# Patient Record
Sex: Male | Born: 1970 | Race: White | Hispanic: No | Marital: Single | Smoking: Never smoker
Health system: Southern US, Community
[De-identification: ages and names within clinical notes are randomized; demographics above are authoritative.]

## PROBLEM LIST (undated history)

## (undated) DIAGNOSIS — T7840XA Allergy, unspecified, initial encounter: Secondary | ICD-10-CM

## (undated) DIAGNOSIS — I10 Essential (primary) hypertension: Secondary | ICD-10-CM

## (undated) DIAGNOSIS — I499 Cardiac arrhythmia, unspecified: Secondary | ICD-10-CM

## (undated) DIAGNOSIS — H269 Unspecified cataract: Secondary | ICD-10-CM

## (undated) DIAGNOSIS — E785 Hyperlipidemia, unspecified: Secondary | ICD-10-CM

## (undated) DIAGNOSIS — F32A Depression, unspecified: Secondary | ICD-10-CM

## (undated) DIAGNOSIS — E119 Type 2 diabetes mellitus without complications: Secondary | ICD-10-CM

## (undated) DIAGNOSIS — R569 Unspecified convulsions: Secondary | ICD-10-CM

## (undated) HISTORY — DX: Allergy, unspecified, initial encounter: T78.40XA

## (undated) HISTORY — DX: Unspecified cataract: H26.9

## (undated) HISTORY — DX: Essential (primary) hypertension: I10

## (undated) HISTORY — DX: Cardiac arrhythmia, unspecified: I49.9

## (undated) HISTORY — DX: Depression, unspecified: F32.A

## (undated) HISTORY — DX: Unspecified convulsions: R56.9

## (undated) HISTORY — PX: CATARACT EXTRACTION: SUR2

## (undated) HISTORY — PX: MOLE REMOVAL: SHX2046

## (undated) HISTORY — DX: Type 2 diabetes mellitus without complications: E11.9

## (undated) HISTORY — PX: DENTAL RESTORATION/EXTRACTION WITH X-RAY: SHX5796

## (undated) HISTORY — PX: OTHER SURGICAL HISTORY: SHX169

## (undated) HISTORY — DX: Hyperlipidemia, unspecified: E78.5

---

## 1996-07-06 HISTORY — PX: HERNIA REPAIR: SHX51

## 2017-11-02 ENCOUNTER — Ambulatory Visit: Payer: Self-pay | Admitting: Internal Medicine

## 2017-11-02 ENCOUNTER — Encounter: Payer: Self-pay | Admitting: Internal Medicine

## 2017-11-02 VITALS — BP 200/120 | HR 76 | Resp 12 | Ht 69.0 in | Wt 235.0 lb

## 2017-11-02 DIAGNOSIS — M79672 Pain in left foot: Secondary | ICD-10-CM

## 2017-11-02 DIAGNOSIS — Z6834 Body mass index (BMI) 34.0-34.9, adult: Secondary | ICD-10-CM

## 2017-11-02 DIAGNOSIS — M79671 Pain in right foot: Secondary | ICD-10-CM

## 2017-11-02 DIAGNOSIS — E119 Type 2 diabetes mellitus without complications: Secondary | ICD-10-CM

## 2017-11-02 DIAGNOSIS — E6609 Other obesity due to excess calories: Secondary | ICD-10-CM

## 2017-11-02 DIAGNOSIS — I10 Essential (primary) hypertension: Secondary | ICD-10-CM

## 2017-11-02 LAB — GLUCOSE, POCT (MANUAL RESULT ENTRY): POC Glucose: 330 mg/dl — AB (ref 70–99)

## 2017-11-02 MED ORDER — LISINOPRIL 10 MG PO TABS: 10.0000 mg | ORAL_TABLET | Freq: Every day | ORAL | 11 refills | Status: DC

## 2017-11-02 MED ORDER — GLUCOSE BLOOD VI STRP: ORAL_STRIP | 11 refills | Status: DC

## 2017-11-02 MED ORDER — AGAMATRIX ULTRA-THIN LANCETS MISC: 11 refills | Status: DC

## 2017-11-02 MED ORDER — AGAMATRIX PRESTO W/DEVICE KIT: PACK | 0 refills | Status: DC

## 2017-11-02 MED ORDER — METFORMIN HCL ER 500 MG PO TB24: 500.0000 mg | ORAL_TABLET | Freq: Every day | ORAL | 11 refills | Status: DC

## 2017-11-02 MED ORDER — GLIPIZIDE 5 MG PO TABS: 5.0000 mg | ORAL_TABLET | Freq: Every day | ORAL | 11 refills | Status: DC

## 2017-11-02 NOTE — Patient Instructions (Signed)
Keeping track of sugars:  In a spiral bound notebook      Month Name (January) Day of month   B   L   D  BT 1   Time /sugar level    7:45 pm  150 2 3 4    Drink a glass of water before every meal Drink 6-8 glasses of water daily Eat three meals daily Eat a protein and healthy fat with every meal (eggs,fish, chicken, [place] and limit red meats) Eat 5 servings of vegetables daily, mix the colors Eat 2 servings of fruit daily with skin, if skin is edible Use smaller plates Put food/utensils down as you chew and swallow each bite Eat at a table with friends/family at least once daily, no TV Do not eat in front of the TV  Recent studies show that people who consume all of their calories in a 12 hour period lose weight more efficiently.  For example, if you eat your first meal at 7:00 a.m., your last meal of the day should be completed by 7:00 p.m.
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
## 2017-11-03 ENCOUNTER — Other Ambulatory Visit: Payer: Self-pay | Admitting: Licensed Clinical Social Worker

## 2017-11-03 NOTE — Telephone Encounter (Signed)
LCSW called patient to introduce social work services available at the clinic. Pt reported that he was "zen" and had no needs at this time.

## 2017-11-04 ENCOUNTER — Other Ambulatory Visit: Payer: Self-pay

## 2017-11-04 DIAGNOSIS — E119 Type 2 diabetes mellitus without complications: Secondary | ICD-10-CM

## 2017-11-04 DIAGNOSIS — Z13220 Encounter for screening for lipoid disorders: Secondary | ICD-10-CM

## 2017-11-04 DIAGNOSIS — Z79899 Other long term (current) drug therapy: Secondary | ICD-10-CM

## 2017-11-05 LAB — HGB A1C W/O EAG: Hgb A1c MFr Bld: 8.7 % — ABNORMAL HIGH (ref 4.8–5.6)

## 2017-11-05 LAB — CBC WITH DIFFERENTIAL/PLATELET
Basophils Absolute: 0 10*3/uL (ref 0.0–0.2)
Basos: 0 %
EOS (ABSOLUTE): 0.2 10*3/uL (ref 0.0–0.4)
Eos: 2 %
HEMATOCRIT: 45.3 % (ref 37.5–51.0)
Hemoglobin: 15.4 g/dL (ref 13.0–17.7)
Immature Grans (Abs): 0 10*3/uL (ref 0.0–0.1)
Immature Granulocytes: 0 %
LYMPHS ABS: 2.6 10*3/uL (ref 0.7–3.1)
Lymphs: 24 %
MCH: 30.1 pg (ref 26.6–33.0)
MCHC: 34 g/dL (ref 31.5–35.7)
MCV: 89 fL (ref 79–97)
MONOS ABS: 0.5 10*3/uL (ref 0.1–0.9)
Monocytes: 5 %
NEUTROS ABS: 7.3 10*3/uL — AB (ref 1.4–7.0)
Neutrophils: 69 %
PLATELETS: 230 10*3/uL (ref 150–379)
RBC: 5.12 x10E6/uL (ref 4.14–5.80)
RDW: 14.5 % (ref 12.3–15.4)
WBC: 10.6 10*3/uL (ref 3.4–10.8)

## 2017-11-05 LAB — COMPREHENSIVE METABOLIC PANEL
ALT: 38 IU/L (ref 0–44)
AST: 26 IU/L (ref 0–40)
Albumin/Globulin Ratio: 1.4 (ref 1.2–2.2)
Albumin: 4 g/dL (ref 3.5–5.5)
Alkaline Phosphatase: 133 IU/L — ABNORMAL HIGH (ref 39–117)
BILIRUBIN TOTAL: 0.5 mg/dL (ref 0.0–1.2)
BUN/Creatinine Ratio: 15 (ref 9–20)
BUN: 12 mg/dL (ref 6–24)
CHLORIDE: 100 mmol/L (ref 96–106)
CO2: 25 mmol/L (ref 20–29)
Calcium: 9.2 mg/dL (ref 8.7–10.2)
Creatinine, Ser: 0.78 mg/dL (ref 0.76–1.27)
GFR calc non Af Amer: 107 mL/min/{1.73_m2} (ref >59)
GFR, EST AFRICAN AMERICAN: 124 mL/min/{1.73_m2} (ref >59)
GLOBULIN, TOTAL: 2.9 g/dL (ref 1.5–4.5)
Glucose: 170 mg/dL — ABNORMAL HIGH (ref 65–99)
Potassium: 4 mmol/L (ref 3.5–5.2)
SODIUM: 140 mmol/L (ref 134–144)
Total Protein: 6.9 g/dL (ref 6.0–8.5)

## 2017-11-05 LAB — LIPID PANEL W/O CHOL/HDL RATIO
CHOLESTEROL TOTAL: 125 mg/dL (ref 100–199)
HDL: 26 mg/dL — AB (ref >39)
LDL CALC: 58 mg/dL (ref 0–99)
Triglycerides: 207 mg/dL — ABNORMAL HIGH (ref 0–149)
VLDL CHOLESTEROL CAL: 41 mg/dL — AB (ref 5–40)

## 2017-11-05 LAB — MICROALBUMIN / CREATININE URINE RATIO
Creatinine, Urine: 85.7 mg/dL
MICROALB/CREAT RATIO: 28.9 mg/g{creat} (ref 0.0–30.0)
MICROALBUM., U, RANDOM: 24.8 ug/mL

## 2017-11-05 MED ORDER — METFORMIN HCL ER 500 MG PO TB24: ORAL_TABLET | ORAL | 11 refills | Status: DC

## 2017-11-17 ENCOUNTER — Ambulatory Visit: Payer: Self-pay | Admitting: Internal Medicine

## 2017-11-17 ENCOUNTER — Encounter: Payer: Self-pay | Admitting: Internal Medicine

## 2017-11-17 VITALS — BP 200/102 | HR 80 | Resp 12 | Ht 69.0 in | Wt 233.0 lb

## 2017-11-17 DIAGNOSIS — I10 Essential (primary) hypertension: Secondary | ICD-10-CM

## 2017-11-17 DIAGNOSIS — E119 Type 2 diabetes mellitus without complications: Secondary | ICD-10-CM

## 2017-11-17 LAB — GLUCOSE, POCT (MANUAL RESULT ENTRY): POC Glucose: 183 mg/dl — AB (ref 70–99)

## 2017-11-17 MED ORDER — HYDROCHLOROTHIAZIDE 25 MG PO TABS: ORAL_TABLET | ORAL | 11 refills | Status: DC

## 2017-11-17 NOTE — Progress Notes (Signed)
   Subjective:    Patient ID: Timothy [name], male    DOB: May 23, 1971, 47 y.o.   MRN: [id number]  HPI   1.  Essential Hypertension:  BP at home running 160-170/ 100 on Lisinopril 10 mg.  Had BMP drawn already this morning.    2.  DM:   Sugar was 183 this afternoon.  Sugars have been running in mid to high 100s, except for recently eating hard candy.   Feet not burning or tingling as much.  Current Meds  Medication Sig  . AGAMATRIX ULTRA-THIN LANCETS MISC Check blood glucose twice daily before meals  . Blood Glucose Monitoring Suppl (AGAMATRIX PRESTO) w/Device KIT Check blood glucose twice daily before meals  . glipiZIDE (GLUCOTROL) 5 MG tablet Take 1 tablet (5 mg total) by mouth daily before breakfast.  . glucose blood (AGAMATRIX PRESTO TEST) test strip Check blood glucose twice daily before meals  . ibuprofen (ADVIL,MOTRIN) 200 MG tablet Take 200 mg by mouth every 6 (six) hours as needed.  Marland Kitchen lisinopril (PRINIVIL,ZESTRIL) 10 MG tablet Take 1 tablet (10 mg total) by mouth daily.  . metFORMIN (GLUCOPHAGE-XR) 500 MG 24 hr tablet 1 tab by mouth twice daily with meals  . oxyCODONE-acetaminophen (PERCOCET) 10-325 MG tablet Take 1 tablet by mouth every 4 (four) hours as needed for pain.    Allergies  Allergen Reactions  . Povidone-Iodine Other (See Comments)    BLISTERS    Review of Systems     Objective:   Physical Exam NAD Lungs:  CTA CV: RRR without murmur or rub, radial pulses normal and equal        Assessment & Plan:  1.  Essential Hypertension:  Continue Lisinopril 10 mg daily and add HCTZ 25 mg daily. BP and pulse check with BMP in 2 weeks.  2.  DM:  Sugars much improved. He did not realize he was to take Metformin twice daily.  Is taking Glipizide just once in the morning with Metformin.  Increase Metformin ER to twice daily.  If sugars drop too low, to cut back on Glipizide to 1/2 tab daily.  3.  Peripheral Neuropathy/likely other nerve damage to feet/legs in past:   Hold on starting Gabapentin as seems to be improved.

## 2017-12-01 ENCOUNTER — Other Ambulatory Visit: Payer: Self-pay

## 2017-12-01 VITALS — BP 148/88 | HR 98

## 2017-12-01 DIAGNOSIS — I10 Essential (primary) hypertension: Secondary | ICD-10-CM

## 2017-12-01 DIAGNOSIS — Z79899 Other long term (current) drug therapy: Secondary | ICD-10-CM

## 2017-12-01 NOTE — Progress Notes (Signed)
Patient BP has come down significantly. Per Dr. Amil Amen have patient to continue current dose of medication. Patient verbalized understanding

## 2017-12-02 LAB — BASIC METABOLIC PANEL
BUN / CREAT RATIO: 16 (ref 9–20)
BUN: 15 mg/dL (ref 6–24)
CO2: 26 mmol/L (ref 20–29)
CREATININE: 0.94 mg/dL (ref 0.76–1.27)
Calcium: 10 mg/dL (ref 8.7–10.2)
Chloride: 96 mmol/L (ref 96–106)
GFR calc non Af Amer: 96 mL/min/{1.73_m2} (ref >59)
GFR, EST AFRICAN AMERICAN: 111 mL/min/{1.73_m2} (ref >59)
Glucose: 214 mg/dL — ABNORMAL HIGH (ref 65–99)
Potassium: 3.7 mmol/L (ref 3.5–5.2)
Sodium: 140 mmol/L (ref 134–144)

## 2018-01-17 DIAGNOSIS — M79672 Pain in left foot: Secondary | ICD-10-CM

## 2018-01-17 DIAGNOSIS — Z6834 Body mass index (BMI) 34.0-34.9, adult: Secondary | ICD-10-CM

## 2018-01-17 DIAGNOSIS — E118 Type 2 diabetes mellitus with unspecified complications: Secondary | ICD-10-CM | POA: Insufficient documentation

## 2018-01-17 DIAGNOSIS — M79671 Pain in right foot: Secondary | ICD-10-CM | POA: Insufficient documentation

## 2018-01-17 DIAGNOSIS — I10 Essential (primary) hypertension: Secondary | ICD-10-CM | POA: Insufficient documentation

## 2018-01-17 DIAGNOSIS — E6609 Other obesity due to excess calories: Secondary | ICD-10-CM | POA: Insufficient documentation

## 2018-01-18 ENCOUNTER — Encounter: Payer: Self-pay | Admitting: Internal Medicine

## 2018-01-18 ENCOUNTER — Ambulatory Visit: Payer: Self-pay | Admitting: Internal Medicine

## 2018-01-18 VITALS — BP 180/102 | HR 96 | Resp 12 | Ht 69.0 in | Wt 232.0 lb

## 2018-01-18 DIAGNOSIS — E6609 Other obesity due to excess calories: Secondary | ICD-10-CM

## 2018-01-18 DIAGNOSIS — Z6834 Body mass index (BMI) 34.0-34.9, adult: Secondary | ICD-10-CM

## 2018-01-18 DIAGNOSIS — I10 Essential (primary) hypertension: Secondary | ICD-10-CM

## 2018-01-18 DIAGNOSIS — E119 Type 2 diabetes mellitus without complications: Secondary | ICD-10-CM

## 2018-01-18 DIAGNOSIS — E1142 Type 2 diabetes mellitus with diabetic polyneuropathy: Secondary | ICD-10-CM

## 2018-01-18 MED ORDER — LISINOPRIL-HYDROCHLOROTHIAZIDE 20-25 MG PO TABS
1.0000 | ORAL_TABLET | Freq: Every day | ORAL | 3 refills | Status: DC
Start: 2018-01-18 — End: 2019-02-09

## 2018-01-18 MED ORDER — GABAPENTIN 100 MG PO CAPS: ORAL_CAPSULE | ORAL | 3 refills | Status: DC

## 2018-01-18 NOTE — Progress Notes (Signed)
   Subjective:    Patient ID: Timothy [name], male    DOB: May 12, 1971, 47 y.o.   MRN: [id number]  HPI   1.  DM:  Sugars generally in 130-150.  Generally both morning and evening meals.  Taking Metformin ER 500 mg twice daily.   Admits to eating a lot of stuff her shouldn't since Memorial Day.   2.  Essential Hypertension:  At home, checking twice daily and running 160/100, though can get into 130-140/high 90s. States he is pain today from doing to much physical work. Missed one day of bp meds only:  Lisinopril and HCTZ.  3.  Ankle and foot pain:  Right foot, plantar aspect at MTP joint.  Cannot tolerate because of pain.   Current Meds  Medication Sig  . AGAMATRIX ULTRA-THIN LANCETS MISC Check blood glucose twice daily before meals  . Blood Glucose Monitoring Suppl (AGAMATRIX PRESTO) w/Device KIT Check blood glucose twice daily before meals  . glipiZIDE (GLUCOTROL) 5 MG tablet Take 1 tablet (5 mg total) by mouth daily before breakfast.  . glucose blood (AGAMATRIX PRESTO TEST) test strip Check blood glucose twice daily before meals  . hydrochlorothiazide (HYDRODIURIL) 25 MG tablet 1 tab by mouth with Lisinopril 10 mg  . ibuprofen (ADVIL,MOTRIN) 200 MG tablet Take 200 mg by mouth every 6 (six) hours as needed.  Marland Kitchen lisinopril (PRINIVIL,ZESTRIL) 10 MG tablet Take 1 tablet (10 mg total) by mouth daily.  . metFORMIN (GLUCOPHAGE-XR) 500 MG 24 hr tablet 1 tab by mouth twice daily with meals    Allergies  Allergen Reactions  . Povidone-Iodine Other (See Comments)    BLISTERS   Review of Systems     Objective:   Physical Exam  NAD Lungs:  CTA CV:  RRR without murmur or rub.  Radial pulses normal and equal   Diabetic Foot Exam - Simple   Simple Foot Form Diabetic Foot exam was performed with the following findings:  Yes 01/18/2018 12:47 PM  Visual Inspection See comments:  Yes Sensation Testing Intact to touch and monofilament testing bilaterally:  Yes Pulse Check Posterior  Tibialis and Dorsalis pulse intact bilaterally:  Yes Comments Discoloration with loss of pigment mottled up foot and ankle.  Chronic appearing and on both feet.       Assessment & Plan:  1.  DM: to work harder at improving lifestyle.  Hopefully can get more active in a pool or once foot pain is improved.     2.  Hypertension:  Increase Lisinopril to 20 mg.  To double up on the 10 mg tabs he has and refill if needed to finish the last of his 90 day supply of HCTZ 25 mg he has at home.   Sent Rx for Lisinopril/HCTZ 20 mg/25 mg to utilize thereafter as less costly. To call in Bp and pulse check in 2 weeks and 1 month  3.  Diabetic peripheral Neuropathy:  Start Gabapentin 100 mg in the evening and gradually titrate to 300 mg 3 times daily.  Fasting labs in 2 month:  FLP, A1C and with me 2 days later.

## 2018-01-18 NOTE — Patient Instructions (Signed)
Gabapentin: Start taking Gabapentin 100 mg cap 1 cap at bedtime. In 3 days, increase to 2 caps at bedtime. In another 3 days, increase to 3 caps at bedtime You should be taking 3 caps at bedtime at this point.  In 3 days, start another 1 cap in the morning In 3 more days, increase to 2 caps in the morning In 3 more days, increase to 3 caps in the morning:  You should be taking 3 caps in the morning and 3 caps at bedtime at this point.  In 3 days, start another dose midday--1 cap In 3 more days, increase to 2 caps midday In 3 more days, increase to 3 caps midday. You should be taking 3 caps 3 times daily at this point.  Stay on this dose until follow up If you do not tolerate increasing the dose at any point, hold on the dose you tolerate or call clinic if having problems 

## 2018-02-08 ENCOUNTER — Telehealth: Payer: Self-pay | Admitting: Internal Medicine

## 2018-02-08 NOTE — Telephone Encounter (Signed)
Doctor Amil Amen wanted message to her, it is as follows: Patient calling in to give bp results Today August 6th pressure is 149/95 83 pulse  Last two three weeks 140 to very low 150  Right foot weirdness still going on even after taking gabapentin NEURONTIN) 100 mg capsule.

## 2018-02-18 ENCOUNTER — Telehealth: Payer: Self-pay | Admitting: Internal Medicine

## 2018-02-18 NOTE — Telephone Encounter (Signed)
Patient instructed to call in to provide BP numbers in the last two weeks. Today BP numbers 136/86 and averages over two weeks 140's/90's.  Cherice please advise.

## 2018-02-21 NOTE — Telephone Encounter (Signed)
To Dr. Mulberry FYI 

## 2018-02-23 NOTE — Telephone Encounter (Signed)
Responded to another note earlier. Notify his foot symptoms will take a while to respond to the Gabapentin.  He should be titrating the dose to 300 mg 3 times daily by increasing by 1 cap every 3 days.  Is he doing that?

## 2018-02-23 NOTE — Telephone Encounter (Signed)
Notify those bps are improving.  To call in again with bps in 1 month

## 2018-03-01 NOTE — Telephone Encounter (Signed)
See next message

## 2018-03-01 NOTE — Telephone Encounter (Signed)
Spoke with patient. States that he is up to Gabapentin 300 mg once a day. States he does not feel he needs to go but he knows he can if he needs to. Patient also aware of BP information. Patient states he is better.

## 2018-03-21 ENCOUNTER — Other Ambulatory Visit: Payer: Self-pay

## 2018-03-21 DIAGNOSIS — Z13220 Encounter for screening for lipoid disorders: Secondary | ICD-10-CM

## 2018-03-21 DIAGNOSIS — E119 Type 2 diabetes mellitus without complications: Secondary | ICD-10-CM

## 2018-03-22 LAB — LIPID PANEL W/O CHOL/HDL RATIO
CHOLESTEROL TOTAL: 131 mg/dL (ref 100–199)
HDL: 24 mg/dL — ABNORMAL LOW (ref >39)
LDL Calculated: 60 mg/dL (ref 0–99)
Triglycerides: 234 mg/dL — ABNORMAL HIGH (ref 0–149)
VLDL CHOLESTEROL CAL: 47 mg/dL — AB (ref 5–40)

## 2018-03-22 LAB — HGB A1C W/O EAG: HEMOGLOBIN A1C: 6.4 % — AB (ref 4.8–5.6)

## 2018-03-23 ENCOUNTER — Encounter: Payer: Self-pay | Admitting: Internal Medicine

## 2018-03-23 ENCOUNTER — Ambulatory Visit: Payer: Self-pay | Admitting: Internal Medicine

## 2018-03-23 VITALS — BP 140/90 | HR 96 | Resp 12 | Ht 69.0 in | Wt 233.0 lb

## 2018-03-23 DIAGNOSIS — I10 Essential (primary) hypertension: Secondary | ICD-10-CM

## 2018-03-23 DIAGNOSIS — E785 Hyperlipidemia, unspecified: Secondary | ICD-10-CM | POA: Insufficient documentation

## 2018-03-23 DIAGNOSIS — E118 Type 2 diabetes mellitus with unspecified complications: Secondary | ICD-10-CM

## 2018-03-23 DIAGNOSIS — M79671 Pain in right foot: Secondary | ICD-10-CM

## 2018-03-23 DIAGNOSIS — Z23 Encounter for immunization: Secondary | ICD-10-CM

## 2018-03-23 DIAGNOSIS — E1142 Type 2 diabetes mellitus with diabetic polyneuropathy: Secondary | ICD-10-CM | POA: Insufficient documentation

## 2018-03-23 MED ORDER — GABAPENTIN 100 MG PO CAPS: ORAL_CAPSULE | ORAL | 11 refills | Status: DC

## 2018-03-23 NOTE — Patient Instructions (Signed)
Titrate gabapentin  Tarsal pad for shoe while awaiting podiatry referral

## 2018-03-23 NOTE — Progress Notes (Signed)
Subjective:    Patient ID: Timothy [name], male    DOB: Nov 12, 1970, 47 y.o.   MRN: [id number]  HPI   1.  DM:  A1C down to 6.4%.  Was physically active for a short period of time due to right foot discomfort with peripheral neuropathy.   Only had low blood sugars (only down to 109) after he was performing cardio exercises.   He continues to eat in a less than optimal way.   Checking sugars intermittently--maybe twice a week or if has a bad meal or doesn't feel well. Generally in 150 or less range  2.  Peripheral Neuropathy:  Sense of not pad under ball of foot or like the foot is swollen. He is only taking 300 mg of Gabapentin at bedtime.  States the numbness and tingling is resolved with this dose. He would be willing to continue titration up for the right foot pain, but he feels the right foot pain is not related to neuropathy He would be interested in seeing Podiatry.    3.  Dyslipidemia:  Low HDL and mildly high Triglycerides, though total and LDL at goal.   Continues with poor diet.  Not clear if will make a change. Feels he can make changes with physical activity only.    4.  HM:  Refuses to come back for influenza.  Discussed Pneumococcal and he would be receptive. No Tdap in past 10 years.  Current Meds  Medication Sig  . AGAMATRIX ULTRA-THIN LANCETS MISC Check blood glucose twice daily before meals  . Blood Glucose Monitoring Suppl (AGAMATRIX PRESTO) w/Device KIT Check blood glucose twice daily before meals  . gabapentin (NEURONTIN) 100 MG capsule Titrate to 3 caps by mouth 3 times daily  . glipiZIDE (GLUCOTROL) 5 MG tablet Take 1 tablet (5 mg total) by mouth daily before breakfast.  . glucose blood (AGAMATRIX PRESTO TEST) test strip Check blood glucose twice daily before meals  . ibuprofen (ADVIL,MOTRIN) 200 MG tablet Take 200 mg by mouth every 6 (six) hours as needed.  Marland Kitchen lisinopril-hydrochlorothiazide (PRINZIDE,ZESTORETIC) 20-25 MG tablet Take 1 tablet by mouth daily.  .  metFORMIN (GLUCOPHAGE-XR) 500 MG 24 hr tablet 1 tab by mouth twice daily with meals  . [DISCONTINUED] hydrochlorothiazide (HYDRODIURIL) 25 MG tablet 1 tab by mouth with Lisinopril 10 mg  . [DISCONTINUED] lisinopril (PRINIVIL,ZESTRIL) 10 MG tablet Take 1 tablet (10 mg total) by mouth daily.   Allergies  Allergen Reactions  . Povidone-Iodine Other (See Comments)    BLISTERS     Review of Systems     Objective:   Physical Exam NAD Lungs:  CTA CV: RRR without murmur or rub.  Radial and DP pulses normal and equal Abd:  S NT, No HSM or mass, + BS Right foot:  With compression of tarsal heads and pressure between 3rd and 4th rays at same level, reproduces pain      Assessment & Plan:  1.  DM:  Much improved control.  Discussed could consider discontinuing Glipizide if he would eat in a better manner. Refuses to come for influenza vaccine tomorrow with free flu clinic. Pneuococcal 23 v and Tdap today. Papers for eye exam given  2.  Dyslipidemia:  Discussed healthier fat sources and swimming to spare right foot pain  3.  Peripheral neuropathy:  Improved.  Not clear if this is part of continued foot pain, likely not.  May try to titrate Gabapentin to 300 mg 3 times daily as discussed originally to see if  helps.  4.  Right foot pain: ?Morton's Neuroma:  Podiatry referral.  Discussed how to place a tarsal pad in shoe. Podiatry referral.  5.  Hypertension:  Improved, but not quite at goal.  Working on physical activity.  Continue current meds.

## 2018-05-05 ENCOUNTER — Other Ambulatory Visit: Payer: Self-pay

## 2018-05-05 MED ORDER — AGAMATRIX ULTRA-THIN LANCETS MISC: 11 refills | Status: DC

## 2018-05-05 MED ORDER — GLUCOSE BLOOD VI STRP: ORAL_STRIP | 11 refills | Status: DC

## 2018-05-05 MED ORDER — GABAPENTIN 100 MG PO CAPS: ORAL_CAPSULE | ORAL | 11 refills | Status: DC

## 2018-07-21 ENCOUNTER — Other Ambulatory Visit: Payer: Self-pay

## 2018-07-21 DIAGNOSIS — E118 Type 2 diabetes mellitus with unspecified complications: Secondary | ICD-10-CM

## 2018-07-21 DIAGNOSIS — E785 Hyperlipidemia, unspecified: Secondary | ICD-10-CM

## 2018-07-22 LAB — HGB A1C W/O EAG: Hgb A1c MFr Bld: 7.5 % — ABNORMAL HIGH (ref 4.8–5.6)

## 2018-07-22 LAB — LIPID PANEL W/O CHOL/HDL RATIO
CHOLESTEROL TOTAL: 131 mg/dL (ref 100–199)
HDL: 24 mg/dL — AB (ref >39)
LDL CALC: 44 mg/dL (ref 0–99)
TRIGLYCERIDES: 316 mg/dL — AB (ref 0–149)
VLDL Cholesterol Cal: 63 mg/dL — ABNORMAL HIGH (ref 5–40)

## 2018-07-25 ENCOUNTER — Other Ambulatory Visit: Payer: Self-pay

## 2018-07-27 ENCOUNTER — Encounter: Payer: Self-pay | Admitting: Internal Medicine

## 2018-07-27 ENCOUNTER — Ambulatory Visit: Payer: Self-pay | Admitting: Internal Medicine

## 2018-07-27 VITALS — BP 150/88 | HR 92 | Resp 12 | Ht 69.0 in | Wt 238.0 lb

## 2018-07-27 DIAGNOSIS — I10 Essential (primary) hypertension: Secondary | ICD-10-CM

## 2018-07-27 DIAGNOSIS — E1142 Type 2 diabetes mellitus with diabetic polyneuropathy: Secondary | ICD-10-CM

## 2018-07-27 DIAGNOSIS — E118 Type 2 diabetes mellitus with unspecified complications: Secondary | ICD-10-CM

## 2018-07-27 DIAGNOSIS — E785 Hyperlipidemia, unspecified: Secondary | ICD-10-CM

## 2018-07-27 LAB — GLUCOSE, POCT (MANUAL RESULT ENTRY): POC Glucose: 248 mg/dl — AB (ref 70–99)

## 2018-07-27 MED ORDER — LISINOPRIL 20 MG PO TABS: 20.0000 mg | ORAL_TABLET | Freq: Every day | ORAL | 11 refills | Status: DC

## 2018-07-27 NOTE — Progress Notes (Signed)
   Subjective:    Patient ID: Timothy [name], male    DOB: [birth year], 48 y.o.   MRN: [id number]  HPI   1.  DM:  Has not been physically active and eating more carbs.  Denies missing Metformin or Glipizide save for today.   Did have an eye check:  Has cataracts.  States no diabetic eye disease.   Was unable to find his glucometer and so has not been monitoring. Starting to have numbness and tingling in fingers again with elevated blood sugars above 200.   A1C up to 7.5%.   States he is able to purchase Agamatrix strips and lancets.   Did not get influenza when we had our free clinics.   2.  Hypertension:  States bp at home has been similar to today even when not missing Lisinopril/HCTZ as did this morning.  3.  Dyslipidemia:  HDL and Trigs not at goal still.  LDL is at goal.  Planning to move to [place] beginning of summer.    Lipid Panel     Component Value Date/Time   CHOL 131 07/21/2018 1005   TRIG 316 (H) 07/21/2018 1005   HDL 24 (L) 07/21/2018 1005   LDLCALC 44 07/21/2018 1005   Current Meds  Medication Sig  . AGAMATRIX ULTRA-THIN LANCETS MISC Check blood glucose twice daily before meals  . Blood Glucose Monitoring Suppl (AGAMATRIX PRESTO) w/Device KIT Check blood glucose twice daily before meals  . gabapentin (NEURONTIN) 100 MG capsule Titrate to 3 caps by mouth 3 times daily  . glipiZIDE (GLUCOTROL) 5 MG tablet Take 1 tablet (5 mg total) by mouth daily before breakfast.  . glucose blood (AGAMATRIX PRESTO TEST) test strip Check blood glucose twice daily before meals  . ibuprofen (ADVIL,MOTRIN) 200 MG tablet Take 200 mg by mouth every 6 (six) hours as needed.  Marland Kitchen lisinopril-hydrochlorothiazide (PRINZIDE,ZESTORETIC) 20-25 MG tablet Take 1 tablet by mouth daily.  . metFORMIN (GLUCOPHAGE-XR) 500 MG 24 hr tablet 1 tab by mouth twice daily with meals    Allergies  Allergen Reactions  . Povidone-Iodine Other (See Comments)    BLISTERS    Review of Systems    Objective:   Physical Exam NAD Lungs:  CTA CV:  RRR without murmur or rub.  Radial pulses normal and equal Abd:  S NT, + BS       Assessment & Plan:  1.  DM:  Worsening control  Encouraged to be consistent with lifestyle and care.   To get back to monitoring sugars States he has the money to purchase glucometer strips and lancets to maintain. Encouraged him to obtain influenza vaccine at [facility] as we are now out.  2.  Hypertension:  As above.  As running high with bp when taking Lisinopril/HCTZ regularly, add another 20 mg of Lisinopril daily. BP and pulse check with BMP in 2 weeks.  3.  Dyslipidemia:  LDL at goal, though HDL and trigs not.  As above with lifestyle changes.  4.  Peripheral Neuropathy:  Gabapentin and control DM better  5.  HM:  HIV check with labs in 2 weeks.  Never checked before.

## 2018-07-27 NOTE — Patient Instructions (Signed)
  HAVE PLAN B TO BE CONSISTENT WITH PHYSICAL ACTIVITY AND EATING HABITS PLEASE  FIND A PLACE FOR MEDICAL CARE IN [place] AS YOU PLAN TO MOVE PLEASE  Drink a glass of water before every meal Drink 6-8 glasses of water daily Eat three meals daily Eat a protein and healthy fat with every meal (eggs,fish, chicken, [place] and limit red meats) Eat 5 servings of vegetables daily, mix the colors Eat 2 servings of fruit daily with skin, if skin is edible Use smaller plates Put food/utensils down as you chew and swallow each bite Eat at a table with friends/family at least once daily, no TV Do not eat in front of the TV  Recent studies show that people who consume all of their calories in a 12 hour period lose weight more efficiently.  For example, if you eat your first meal at 7:00 a.m., your last meal of the day should be completed by 7:00 p.m.

## 2018-08-11 ENCOUNTER — Ambulatory Visit: Payer: Self-pay

## 2018-08-11 VITALS — BP 132/78 | HR 88

## 2018-08-11 DIAGNOSIS — I10 Essential (primary) hypertension: Secondary | ICD-10-CM

## 2018-08-11 NOTE — Progress Notes (Signed)
Patient BP back in normal range. Patient informed to continue to current dose of medication. Patient verbalized understanding.

## 2018-08-12 LAB — BASIC METABOLIC PANEL
BUN/Creatinine Ratio: 12 (ref 9–20)
BUN: 12 mg/dL (ref 6–24)
CO2: 24 mmol/L (ref 20–29)
CREATININE: 1.03 mg/dL (ref 0.76–1.27)
Calcium: 9.4 mg/dL (ref 8.7–10.2)
Chloride: 92 mmol/L — ABNORMAL LOW (ref 96–106)
GFR calc Af Amer: 99 mL/min/{1.73_m2} (ref >59)
GFR calc non Af Amer: 85 mL/min/{1.73_m2} (ref >59)
Glucose: 235 mg/dL — ABNORMAL HIGH (ref 65–99)
POTASSIUM: 3.8 mmol/L (ref 3.5–5.2)
Sodium: 138 mmol/L (ref 134–144)

## 2018-10-26 ENCOUNTER — Telehealth (INDEPENDENT_AMBULATORY_CARE_PROVIDER_SITE_OTHER): Payer: Self-pay | Admitting: Internal Medicine

## 2018-10-26 ENCOUNTER — Other Ambulatory Visit: Payer: Self-pay

## 2018-10-26 VITALS — BP 160/94 | HR 80

## 2018-10-26 DIAGNOSIS — M778 Other enthesopathies, not elsewhere classified: Secondary | ICD-10-CM

## 2018-10-26 DIAGNOSIS — E1142 Type 2 diabetes mellitus with diabetic polyneuropathy: Secondary | ICD-10-CM

## 2018-10-26 DIAGNOSIS — I10 Essential (primary) hypertension: Secondary | ICD-10-CM

## 2018-10-26 DIAGNOSIS — M7582 Other shoulder lesions, left shoulder: Secondary | ICD-10-CM

## 2018-10-26 DIAGNOSIS — E118 Type 2 diabetes mellitus with unspecified complications: Secondary | ICD-10-CM

## 2018-10-26 DIAGNOSIS — E785 Hyperlipidemia, unspecified: Secondary | ICD-10-CM

## 2018-10-26 NOTE — Progress Notes (Signed)
    Subjective:    Patient ID: Timothy [name], male   DOB: 16-Apr-1971, 48 y.o.   MRN: [id number]   HPI   Virtual Video Visit with [employer]. Switched to phone only as video stopped after 15 minutes. Total of 40 minutes with visit.  1.  DM:  Not really checking sugars regularly. Only checks when he doesn't follow a good diet or when not feeling well.   When he gets above 200, he feels that's when he gets numbness in hands as notification. He is now much more physically active since January.  States when he was checking, his sugars were under 150.   He injured his shoulder with the activity 3 weeks ago.     2.  Hypertension:  Increased Lisinopril to 40 mg with continued 20 mg of HCTZ as part of combination with 20 mg of the Lisinopril. BP has been running 130/80.   He is a bit stressed right now as poor sleep   3.  Left shoulder pain and stiffness:  Has developed own physically therapy with door jamb and a massage hook   4.  Dyslipidemia:  Had some difficulty getting protein for a couple of weeks with COVID-19 and ate more carbs.    5.  Peripheral Neuropathy:  He has developed his own gabapentin dosing regimen, which he feels is working well for him.   Gabapentin 200 mg in AM, 100 mg at lunch, 300 mg midday, 100 mg at bedtime.    Current Meds  Medication Sig  . AGAMATRIX ULTRA-THIN LANCETS MISC Check blood glucose twice daily before meals  . Blood Glucose Monitoring Suppl (AGAMATRIX PRESTO) w/Device KIT Check blood glucose twice daily before meals  . gabapentin (NEURONTIN) 100 MG capsule Titrate to 3 caps by mouth 3 times daily  . glipiZIDE (GLUCOTROL) 5 MG tablet Take 1 tablet (5 mg total) by mouth daily before breakfast.  . glucose blood (AGAMATRIX PRESTO TEST) test strip Check blood glucose twice daily before meals  . ibuprofen (ADVIL,MOTRIN) 200 MG tablet Take 200 mg by mouth every 6 (six) hours as needed.  Marland Kitchen lisinopril (PRINIVIL,ZESTRIL) 20 MG tablet Take 1 tablet (20 mg total) by  mouth daily.  Marland Kitchen lisinopril-hydrochlorothiazide (PRINZIDE,ZESTORETIC) 20-25 MG tablet Take 1 tablet by mouth daily.  . metFORMIN (GLUCOPHAGE-XR) 500 MG 24 hr tablet 1 tab by mouth twice daily with meals   Allergies  Allergen Reactions  . Povidone-Iodine Other (See Comments)    BLISTERS     Review of Systems    Objective:   There were no vitals taken for this visit.  Physical Exam Looks well Full ROM of left shoulder with flexion, external rotation, about 90% with abduction, along with mild discomfort, mildly decreased with internal rotation and some associated discomfort as well.  Assessment & Plan  1.  DM:  Not clear how well controlled as not checking sugars frequently.  Will arrange for fasting labs in next 2-4 weeks. He may be moving to [place] in June.  2.  Hypertension:  He was moving about and aggravated with neighbors at time of home bp check today.  Asked him to call in another bp check after resting later today. Sounds like it has been better controlled since January with additional 20 mg Lisinopril. Kidney function remained stable after the addition.  3.  Dyslipidemia:  FLP with fasting labs in coming weeks.  If he will not be moving to [place], to follow up in 3-4 months.  3.  Dyslip

## 2018-10-27 NOTE — Progress Notes (Signed)
NOTED

## 2018-11-23 ENCOUNTER — Other Ambulatory Visit: Payer: Self-pay | Admitting: Internal Medicine

## 2018-11-29 ENCOUNTER — Other Ambulatory Visit: Payer: Self-pay

## 2018-11-29 DIAGNOSIS — E785 Hyperlipidemia, unspecified: Secondary | ICD-10-CM

## 2018-11-30 ENCOUNTER — Other Ambulatory Visit: Payer: Self-pay | Admitting: Internal Medicine

## 2018-11-30 LAB — LIPID PANEL W/O CHOL/HDL RATIO
Cholesterol, Total: 124 mg/dL (ref 100–199)
HDL: 22 mg/dL — ABNORMAL LOW (ref >39)
LDL Calculated: 42 mg/dL (ref 0–99)
Triglycerides: 301 mg/dL — ABNORMAL HIGH (ref 0–149)
VLDL Cholesterol Cal: 60 mg/dL — ABNORMAL HIGH (ref 5–40)

## 2018-11-30 MED ORDER — FISH OIL 1000 MG PO CAPS: ORAL_CAPSULE | ORAL | 0 refills | Status: DC

## 2018-11-30 NOTE — Progress Notes (Signed)
Adding fish oil

## 2018-12-27 ENCOUNTER — Other Ambulatory Visit: Payer: Self-pay | Admitting: Internal Medicine

## 2019-02-06 ENCOUNTER — Other Ambulatory Visit (INDEPENDENT_AMBULATORY_CARE_PROVIDER_SITE_OTHER): Payer: Self-pay

## 2019-02-06 ENCOUNTER — Other Ambulatory Visit: Payer: Self-pay

## 2019-02-06 DIAGNOSIS — E785 Hyperlipidemia, unspecified: Secondary | ICD-10-CM

## 2019-02-07 LAB — LIPID PANEL W/O CHOL/HDL RATIO
Cholesterol, Total: 125 mg/dL (ref 100–199)
HDL: 21 mg/dL — ABNORMAL LOW (ref >39)
LDL Calculated: 60 mg/dL (ref 0–99)
Triglycerides: 219 mg/dL — ABNORMAL HIGH (ref 0–149)
VLDL Cholesterol Cal: 44 mg/dL — ABNORMAL HIGH (ref 5–40)

## 2019-02-09 ENCOUNTER — Other Ambulatory Visit: Payer: Self-pay | Admitting: Internal Medicine

## 2019-02-24 ENCOUNTER — Other Ambulatory Visit: Payer: Self-pay

## 2019-02-24 ENCOUNTER — Encounter: Payer: Self-pay | Admitting: Internal Medicine

## 2019-02-24 ENCOUNTER — Ambulatory Visit: Payer: Self-pay | Admitting: Internal Medicine

## 2019-02-24 VITALS — BP 180/90 | HR 100 | Resp 12 | Ht 69.0 in | Wt 243.0 lb

## 2019-02-24 DIAGNOSIS — N529 Male erectile dysfunction, unspecified: Secondary | ICD-10-CM

## 2019-02-24 DIAGNOSIS — E785 Hyperlipidemia, unspecified: Secondary | ICD-10-CM

## 2019-02-24 DIAGNOSIS — E118 Type 2 diabetes mellitus with unspecified complications: Secondary | ICD-10-CM

## 2019-02-24 DIAGNOSIS — I10 Essential (primary) hypertension: Secondary | ICD-10-CM

## 2019-02-24 MED ORDER — METOPROLOL TARTRATE 25 MG PO TABS: ORAL_TABLET | ORAL | 11 refills | Status: DC

## 2019-02-24 MED ORDER — SILDENAFIL CITRATE 20 MG PO TABS: ORAL_TABLET | ORAL | 1 refills | Status: DC

## 2019-02-24 MED ORDER — ASPIRIN EC 81 MG PO TBEC: 81.0000 mg | DELAYED_RELEASE_TABLET | Freq: Every day | ORAL | Status: DC

## 2019-02-24 NOTE — Progress Notes (Signed)
Subjective:    Patient ID: Timothy [name], male   DOB: [birth year], 48 y.o.   MRN: [id number]   HPI   Lots of stress with his move to [place]/difficulty getting work done at his home with roommates all there now as well during pandemic  1.  Hyperlipidemia:  Taking fish oil 1000 mg twice daily, not 2000 mg twice daily.  He can increase.    2.  DM:  Did not get an A1C with last labs.  Sugars in the morning are mid 100s and above 200 in the evening depending what he eats.  He is not eating well. He does have a desk bike and goes about 20 miles daily currently.  Has been doing this for 1 month.  3.  ED:  Bought Sildenafil through the interweb as difficulties with erection.      4.  Hypertension:  BP running 140/80.  Describes palpitations--states his pulse is regular, but the amplitude of the pulse varies every other beat.  He is tracking this with his phone monitoring the pulse, not him feeling it.  He does not have good sensation in his fingers to check himself.  No chest pain.  Current Meds  Medication Sig  . AGAMATRIX ULTRA-THIN LANCETS MISC Check blood glucose twice daily before meals  . Blood Glucose Monitoring Suppl (AGAMATRIX PRESTO) w/Device KIT Check blood glucose twice daily before meals  . gabapentin (NEURONTIN) 100 MG capsule Titrate to 3 caps by mouth 3 times daily  . glipiZIDE (GLUCOTROL) 5 MG tablet TAKE 1 TABLET BY MOUTH ONCE DAILY BEFORE BREAKFAST  . glucose blood (AGAMATRIX PRESTO TEST) test strip Check blood glucose twice daily before meals  . ibuprofen (ADVIL,MOTRIN) 200 MG tablet Take 200 mg by mouth every 6 (six) hours as needed.  Marland Kitchen lisinopril (PRINIVIL,ZESTRIL) 20 MG tablet Take 1 tablet (20 mg total) by mouth daily.  Marland Kitchen lisinopril-hydrochlorothiazide (ZESTORETIC) 20-25 MG tablet Take 1 tablet by mouth once daily  . metFORMIN (GLUCOPHAGE-XR) 500 MG 24 hr tablet TAKE 1 TABLET BY MOUTH TWICE DAILY WITH MEALS  . Omega-3 Fatty Acids (FISH OIL) 1000 MG CAPS 2 caps by mouth  twice daily.   Allergies  Allergen Reactions  . Povidone-Iodine Other (See Comments)    BLISTERS     Review of Systems    Objective:   BP (!) 180/90 (BP Location: Left Arm, Patient Position: Sitting, Cuff Size: Normal)   Pulse 100   Resp 12   Ht 5' 9"  (1.753 m)   Wt 243 lb (110.2 kg)   BMI 35.88 kg/m   Physical Exam  Appears a bit stressed Lungs:  CTA CV:  RRR with normal S1 and S2, No S3, S4 or murmur.  Radial pulses normal and equal.  A bit tachycardic LE:  No edema   Assessment & Plan  1.  Hypertension:  Add Metoprolol 12.5 mg twice daily.  Call bp and pulse to clinic in 1 week.  Will likely need 25 mg twice daily.  Continue Lisinopril/HCTZ combinations. ASA 81 mg daily as well.  2.  DM:  A1C Encouraged to ask about flu shot clinic when calls in 1 week.  3.  ED:  Good Rx coupon for the 20 mg Sildenafil.  Discussed risks if develops chest pain.    4.  Dyslipidemia:  To gradually increase fish oil to the 2000 mg twice daily last discussed.  Continue with desk bike.  When moves to [place], should have more independence with using the kitchen  and eating healthier.

## 2019-02-24 NOTE — Patient Instructions (Signed)
Call in bps and pulse in 1 week.   No Viagra if you develop chest pain and notify EMS you take Viagra

## 2019-02-25 LAB — HGB A1C W/O EAG: Hgb A1c MFr Bld: 7.5 % — ABNORMAL HIGH (ref 4.8–5.6)

## 2019-03-06 ENCOUNTER — Telehealth: Payer: Self-pay | Admitting: Internal Medicine

## 2019-03-06 NOTE — Telephone Encounter (Signed)
Patient called to provide BP reading today at 3:15 p.m 152/88;  Patient wanted to mention the Metropolol  raised his BP 10 points up on both systolic/diastolic. Patient stated fatigue was really bad especially over the weekend and "made monitoring my brain chemistry really bad." Patient stated on Saturday had a really bad mood and Sunday slept until 5:00 pm; not sure what time went to bed.   Please advise.

## 2019-03-07 ENCOUNTER — Other Ambulatory Visit: Payer: Self-pay

## 2019-03-07 MED ORDER — AMLODIPINE BESYLATE 5 MG PO TABS: 5.0000 mg | ORAL_TABLET | Freq: Every day | ORAL | 1 refills | Status: DC

## 2019-03-07 NOTE — Telephone Encounter (Signed)
Spoke with patient. States he has not tried Amlodipine. Per Dr. Amil Amen start Amlodipine 5 mg 1 daily. Spoke with patient informed of message and he verbalized understanding. Rx sent to [facility].

## 2019-03-07 NOTE — Telephone Encounter (Signed)
Spoke with patient. States since starting Metoprolol he is experiencing mood swings, fatigue and head fg. Patient states the first night he took medication BP was 121/80 but since that night all other readings have been in the 150's/90's. States Sunday he was so fatigued it was 5 pm before he could get out of the bed. Per Dr. Amil Amen find out if he has taken Amlodipine in the past and if so what was the reason for stopping the medication.

## 2019-03-29 ENCOUNTER — Other Ambulatory Visit: Payer: Self-pay | Admitting: Internal Medicine

## 2019-04-14 ENCOUNTER — Other Ambulatory Visit: Payer: Self-pay

## 2019-04-14 ENCOUNTER — Ambulatory Visit (INDEPENDENT_AMBULATORY_CARE_PROVIDER_SITE_OTHER): Payer: Self-pay | Admitting: Internal Medicine

## 2019-04-14 ENCOUNTER — Encounter: Payer: Self-pay | Admitting: Internal Medicine

## 2019-04-14 VITALS — BP 170/90 | HR 86 | Resp 12 | Ht 69.0 in | Wt 243.0 lb

## 2019-04-14 DIAGNOSIS — N529 Male erectile dysfunction, unspecified: Secondary | ICD-10-CM

## 2019-04-14 DIAGNOSIS — E118 Type 2 diabetes mellitus with unspecified complications: Secondary | ICD-10-CM

## 2019-04-14 DIAGNOSIS — I10 Essential (primary) hypertension: Secondary | ICD-10-CM

## 2019-04-14 DIAGNOSIS — E1142 Type 2 diabetes mellitus with diabetic polyneuropathy: Secondary | ICD-10-CM | POA: Diagnosis not present

## 2019-04-14 MED ORDER — LISINOPRIL-HYDROCHLOROTHIAZIDE 20-25 MG PO TABS: ORAL_TABLET | ORAL | 3 refills | Status: DC

## 2019-04-14 MED ORDER — LISINOPRIL 20 MG PO TABS: ORAL_TABLET | ORAL | 3 refills | Status: DC

## 2019-04-14 MED ORDER — SILDENAFIL CITRATE 20 MG PO TABS: ORAL_TABLET | ORAL | 4 refills | Status: DC

## 2019-04-14 MED ORDER — AMLODIPINE BESYLATE 5 MG PO TABS: 5.0000 mg | ORAL_TABLET | Freq: Every day | ORAL | 11 refills | Status: DC

## 2019-04-14 MED ORDER — GABAPENTIN 300 MG PO CAPS: ORAL_CAPSULE | ORAL | 3 refills | Status: DC

## 2019-04-14 NOTE — Progress Notes (Signed)
    Subjective:    Patient ID: Timothy [name], male   DOB: Nov 05, 1970, 48 y.o.   MRN: [id number]   HPI   Will not be moving up to [place] for another year.   1.  Hypertension: Amlodipine was not at the pharmacy and under a lot of stress.  Discussed needs to call if his meds are not available.    Current Meds  Medication Sig  . AGAMATRIX ULTRA-THIN LANCETS MISC Check blood glucose twice daily before meals  . aspirin EC 81 MG tablet Take 1 tablet (81 mg total) by mouth daily.  . Blood Glucose Monitoring Suppl (AGAMATRIX PRESTO) w/Device KIT Check blood glucose twice daily before meals  . gabapentin (NEURONTIN) 100 MG capsule Titrate to 3 caps by mouth 3 times daily  . glipiZIDE (GLUCOTROL) 5 MG tablet TAKE 1 TABLET BY MOUTH ONCE DAILY BEFORE BREAKFAST  . glucose blood (AGAMATRIX PRESTO TEST) test strip Check blood glucose twice daily before meals  . ibuprofen (ADVIL,MOTRIN) 200 MG tablet Take 200 mg by mouth every 6 (six) hours as needed.  Marland Kitchen lisinopril (PRINIVIL,ZESTRIL) 20 MG tablet Take 1 tablet (20 mg total) by mouth daily.  Marland Kitchen lisinopril-hydrochlorothiazide (ZESTORETIC) 20-25 MG tablet Take 1 tablet by mouth once daily  . metFORMIN (GLUCOPHAGE-XR) 500 MG 24 hr tablet TAKE 1 TABLET BY MOUTH TWICE DAILY WITH MEALS  . Omega-3 Fatty Acids (FISH OIL) 1000 MG CAPS 2 caps by mouth twice daily.  . sildenafil (REVATIO) 20 MG tablet 1 tab by mouth as needed prior to activity   Allergies  Allergen Reactions  . Povidone-Iodine Other (See Comments)    BLISTERS     Review of Systems    Objective:   BP (!) 170/90 (BP Location: Left Arm, Patient Position: Sitting, Cuff Size: Normal)   Pulse 86   Resp 12   Ht '5\' 9"'$  (1.753 m)   Wt 243 lb (110.2 kg)   BMI 35.88 kg/m   Physical Exam  NAD HEENT:  PERRL, EOMI Neck: Supple, No adenopathy Chest:  CTA CV:  RRR without murmur or rub.  Radial pulses normal and equal.   LE atrophied lower legs. Skin shiny   Assessment & Plan  1.   Hypertension:  Urged to never miss meds.  All refilled for 1 year.  BP monitor he brought in today with similar reading as ours.  Can call in bp in 1 week.  2.  DM:  Not as well controlled in August.  Fasting labs in 3 months.  FLP, CMP, urine microalbumin/crea, A1C.. OV with me 2 days later. Flu vaccine Thursday  3.  Peripheral Neuropathy:  Change to gabapentin 300 mg caps for lower cost.  Generally only taking twice daily.  4.  ED:  Sildenafil lower dose working well for him.

## 2019-04-14 NOTE — Patient Instructions (Signed)
Call in blood pressure in 1 week.

## 2019-07-14 ENCOUNTER — Other Ambulatory Visit: Payer: Self-pay

## 2019-07-18 ENCOUNTER — Ambulatory Visit: Payer: Self-pay | Admitting: Internal Medicine

## 2019-07-25 ENCOUNTER — Other Ambulatory Visit (INDEPENDENT_AMBULATORY_CARE_PROVIDER_SITE_OTHER): Payer: BC Managed Care – PPO

## 2019-07-25 DIAGNOSIS — E118 Type 2 diabetes mellitus with unspecified complications: Secondary | ICD-10-CM

## 2019-07-25 DIAGNOSIS — E785 Hyperlipidemia, unspecified: Secondary | ICD-10-CM

## 2019-07-25 DIAGNOSIS — Z79899 Other long term (current) drug therapy: Secondary | ICD-10-CM

## 2019-07-26 LAB — MICROALBUMIN / CREATININE URINE RATIO
Creatinine, Urine: 81.3 mg/dL
Microalb/Creat Ratio: 16 mg/g creat (ref 0–29)
Microalbumin, Urine: 13 ug/mL

## 2019-07-26 LAB — LIPID PANEL W/O CHOL/HDL RATIO
Cholesterol, Total: 133 mg/dL (ref 100–199)
HDL: 26 mg/dL — ABNORMAL LOW (ref >39)
LDL Chol Calc (NIH): 73 mg/dL (ref 0–99)
Triglycerides: 198 mg/dL — ABNORMAL HIGH (ref 0–149)
VLDL Cholesterol Cal: 34 mg/dL (ref 5–40)

## 2019-07-26 LAB — COMPREHENSIVE METABOLIC PANEL
ALT: 31 IU/L (ref 0–44)
AST: 21 IU/L (ref 0–40)
Albumin/Globulin Ratio: 1.5 (ref 1.2–2.2)
Albumin: 4.3 g/dL (ref 4.0–5.0)
Alkaline Phosphatase: 115 IU/L (ref 39–117)
BUN/Creatinine Ratio: 12 (ref 9–20)
BUN: 13 mg/dL (ref 6–24)
Bilirubin Total: 0.4 mg/dL (ref 0.0–1.2)
CO2: 27 mmol/L (ref 20–29)
Calcium: 9.4 mg/dL (ref 8.7–10.2)
Chloride: 96 mmol/L (ref 96–106)
Creatinine, Ser: 1.05 mg/dL (ref 0.76–1.27)
GFR calc Af Amer: 96 mL/min/{1.73_m2} (ref >59)
GFR calc non Af Amer: 83 mL/min/{1.73_m2} (ref >59)
Globulin, Total: 2.9 g/dL (ref 1.5–4.5)
Glucose: 139 mg/dL — ABNORMAL HIGH (ref 65–99)
Potassium: 3.9 mmol/L (ref 3.5–5.2)
Sodium: 139 mmol/L (ref 134–144)
Total Protein: 7.2 g/dL (ref 6.0–8.5)

## 2019-07-26 LAB — HGB A1C W/O EAG: Hgb A1c MFr Bld: 6.5 % — ABNORMAL HIGH (ref 4.8–5.6)

## 2019-07-28 ENCOUNTER — Ambulatory Visit: Payer: BC Managed Care – PPO | Admitting: Internal Medicine

## 2019-07-28 ENCOUNTER — Encounter: Payer: Self-pay | Admitting: Internal Medicine

## 2019-07-28 ENCOUNTER — Other Ambulatory Visit: Payer: Self-pay

## 2019-07-28 VITALS — BP 152/80 | HR 98 | Resp 12 | Ht 69.0 in | Wt 220.0 lb

## 2019-07-28 DIAGNOSIS — E118 Type 2 diabetes mellitus with unspecified complications: Secondary | ICD-10-CM | POA: Diagnosis not present

## 2019-07-28 DIAGNOSIS — E785 Hyperlipidemia, unspecified: Secondary | ICD-10-CM

## 2019-07-28 DIAGNOSIS — R002 Palpitations: Secondary | ICD-10-CM | POA: Diagnosis not present

## 2019-07-28 DIAGNOSIS — H269 Unspecified cataract: Secondary | ICD-10-CM

## 2019-07-28 DIAGNOSIS — M25512 Pain in left shoulder: Secondary | ICD-10-CM | POA: Insufficient documentation

## 2019-07-28 DIAGNOSIS — I10 Essential (primary) hypertension: Secondary | ICD-10-CM | POA: Diagnosis not present

## 2019-07-28 DIAGNOSIS — E1142 Type 2 diabetes mellitus with diabetic polyneuropathy: Secondary | ICD-10-CM

## 2019-07-28 LAB — GLUCOSE, POCT (MANUAL RESULT ENTRY): POC Glucose: 206 mg/dl — AB (ref 70–99)
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
## 2019-08-02 ENCOUNTER — Encounter: Payer: Self-pay | Admitting: Family Medicine

## 2019-08-02 ENCOUNTER — Ambulatory Visit: Payer: Self-pay

## 2019-08-02 ENCOUNTER — Other Ambulatory Visit: Payer: Self-pay

## 2019-08-02 ENCOUNTER — Ambulatory Visit: Payer: BC Managed Care – PPO | Admitting: Family Medicine

## 2019-08-02 DIAGNOSIS — G8929 Other chronic pain: Secondary | ICD-10-CM

## 2019-08-02 DIAGNOSIS — M25512 Pain in left shoulder: Secondary | ICD-10-CM
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
## 2019-08-17 ENCOUNTER — Other Ambulatory Visit: Payer: Self-pay

## 2019-08-17 ENCOUNTER — Ambulatory Visit: Payer: BC Managed Care – PPO | Attending: Family Medicine | Admitting: Physical Therapy

## 2019-08-17 ENCOUNTER — Encounter: Payer: Self-pay | Admitting: Physical Therapy

## 2019-08-17 DIAGNOSIS — M25612 Stiffness of left shoulder, not elsewhere classified: Secondary | ICD-10-CM | POA: Diagnosis present

## 2019-08-17 DIAGNOSIS — M25512 Pain in left shoulder: Secondary | ICD-10-CM
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
## 2019-08-17 NOTE — Patient Instructions (Signed)
Access Code: [email or web address]  URL: [email or web address]  Date: 08/17/2019  Prepared by: Lum Babe   Exercises Standing Shoulder Flexion Wall Walk - 10 reps - 1 sets - 10 hold - 2x daily - 7x weekly Seated Shoulder External Rotation PROM on Table - 10 reps - 1 sets - 10 hold - 2x daily - 7x weekly Standing Shoulder Internal Rotation Stretch with Towel - 10 reps - 1 sets - 10 hold - 2x daily - 7x weekly

## 2019-08-21 ENCOUNTER — Ambulatory Visit (HOSPITAL_COMMUNITY)
Admission: RE | Admit: 2019-08-21 | Discharge: 2019-08-21 | Disposition: A | Discharge status: Home / Self Care | Payer: BC Managed Care – PPO | Attending: Psychiatry | Admitting: Psychiatry

## 2019-08-21 DIAGNOSIS — Z915 Personal history of self-harm: Secondary | ICD-10-CM | POA: Diagnosis not present

## 2019-08-21 DIAGNOSIS — F411 Generalized anxiety disorder: Secondary | ICD-10-CM | POA: Diagnosis present

## 2019-08-22 NOTE — H&P (Signed)
Behavioral Health Medical Screening Exam  Timothy [name] is an 49 y.o. male.  Total Time spent with patient: 15 minutes  Psychiatric Specialty Exam: Physical Exam  Constitutional: He is oriented to person, place, and time. He appears well-developed and well-nourished. No distress.  HENT:  Head: Normocephalic and atraumatic.  Right Ear: External ear normal.  Left Ear: External ear normal.  Eyes: Pupils are equal, round, and reactive to light. Right eye exhibits no discharge. Left eye exhibits no discharge.  Respiratory: Effort normal. No respiratory distress.  Musculoskeletal:        General: Normal range of motion.  Neurological: He is alert and oriented to person, place, and time.  Skin: He is not diaphoretic.  Psychiatric: His speech is normal and behavior is normal. His mood appears anxious. Thought content is not paranoid and not delusional. He exhibits a depressed mood. He expresses no homicidal and no suicidal ideation.    Review of Systems  Constitutional: Positive for appetite change and unexpected weight change. Negative for activity change, chills, diaphoresis, fatigue and fever.  HENT: Negative.   Respiratory: Negative for cough and shortness of breath.   Gastrointestinal: Negative for diarrhea, nausea and vomiting.  Musculoskeletal: Negative.   Skin: Negative.   Psychiatric/Behavioral: Positive for dysphoric mood, sleep disturbance and suicidal ideas. Negative for hallucinations and self-injury. The patient is nervous/anxious.     Blood pressure 121/85, pulse (!) 117, temperature 98.4 F (36.9 C), temperature source Oral, resp. rate 20.There is no height or weight on file to calculate BMI.  General Appearance: Casual and Well Groomed  Eye Contact:  Good  Speech:  Clear and Coherent and Normal Rate  Volume:  Normal  Mood:  Anxious and Depressed  Affect:  Congruent and Depressed  Thought Process:  Coherent, Goal Directed, Linear and Descriptions of Associations: Intact   Orientation:  Full (Time, Place, and Person)  Thought Content:  Logical and Hallucinations: None  Suicidal Thoughts:  No  Homicidal Thoughts:  No  Memory:  Immediate;   Good  Judgement:  Fair  Insight:  Fair  Psychomotor Activity:  Normal  Concentration: Concentration: Fair  Recall:  Good  Fund of Knowledge:Good  Language: Good  Akathisia:  Negative  Handed:  Right  AIMS (if indicated):     Assets:  Communication Skills Housing Leisure Time Physical Health  Sleep:       Musculoskeletal: Strength & Muscle Tone: within normal limits Gait & Station: normal Patient leans: N/A  Blood pressure 121/85, pulse (!) 117, temperature 98.4 F (36.9 C), temperature source Oral, resp. rate 20.  Recommendations:  Based on my evaluation the patient does not appear to have an emergency medical condition.  Disposition: No evidence of imminent risk to self or others at present.   Patient does not meet criteria for psychiatric inpatient admission. Supportive therapy provided about ongoing stressors. Discussed crisis plan, support from social network, calling 911, coming to the Emergency Department, and calling Suicide Hotline.  Rozetta Nunnery, NP 08/22/2019, 6:00 AM
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
## 2019-08-30 ENCOUNTER — Encounter: Payer: Self-pay | Admitting: Physical Therapy

## 2019-08-30 ENCOUNTER — Other Ambulatory Visit: Payer: Self-pay

## 2019-08-30 ENCOUNTER — Ambulatory Visit: Payer: BC Managed Care – PPO | Admitting: Physical Therapy

## 2019-08-30 DIAGNOSIS — M25512 Pain in left shoulder: Secondary | ICD-10-CM | POA: Diagnosis not present

## 2019-08-30 DIAGNOSIS — M25612 Stiffness of left shoulder, not elsewhere classified: Secondary | ICD-10-CM
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
## 2019-08-30 NOTE — Patient Instructions (Signed)
Access Code: [email or web address]  URL: [email or web address]  Date: 08/30/2019  Prepared by: Jeral Pinch   Exercises Shoulder External Rotation Reactive Isometrics - 10-15 reps - 2 sets - 1x daily Standing Shoulder Internal Rotation with Anchored Resistance - 10-15 reps - 2 sets - 1x daily Staggered Stance Scapular Retraction and Depression with Resistance - 10-15 reps - 2 sets - 1x daily Scapular Retraction with Resistance - 10-15 reps - 2 sets - 1x daily

## 2019-09-05 ENCOUNTER — Encounter: Payer: BC Managed Care – PPO | Admitting: Physical Therapy

## 2019-09-06 ENCOUNTER — Other Ambulatory Visit: Payer: Self-pay

## 2019-09-06 MED ORDER — GABAPENTIN 300 MG PO CAPS: ORAL_CAPSULE | ORAL | 11 refills | Status: DC

## 2019-09-13 ENCOUNTER — Ambulatory Visit: Payer: BC Managed Care – PPO | Attending: Family Medicine | Admitting: Physical Therapy

## 2019-09-13 ENCOUNTER — Other Ambulatory Visit: Payer: Self-pay

## 2019-09-13 ENCOUNTER — Encounter: Payer: Self-pay | Admitting: Physical Therapy

## 2019-09-13 DIAGNOSIS — M25512 Pain in left shoulder: Secondary | ICD-10-CM | POA: Diagnosis present

## 2019-09-13 DIAGNOSIS — M25612 Stiffness of left shoulder, not elsewhere classified: Secondary | ICD-10-CM | POA: Insufficient documentation
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
## 2019-09-14 ENCOUNTER — Encounter: Payer: BC Managed Care – PPO | Admitting: Physical Therapy

## 2019-09-20 ENCOUNTER — Encounter: Payer: BC Managed Care – PPO | Admitting: Physical Therapy

## 2019-09-27 ENCOUNTER — Encounter: Payer: Self-pay | Admitting: Physical Therapy

## 2019-09-27 ENCOUNTER — Other Ambulatory Visit: Payer: Self-pay

## 2019-09-27 ENCOUNTER — Ambulatory Visit: Payer: BC Managed Care – PPO | Admitting: Physical Therapy

## 2019-09-27 DIAGNOSIS — M25612 Stiffness of left shoulder, not elsewhere classified: Secondary | ICD-10-CM

## 2019-09-27 DIAGNOSIS — M25512 Pain in left shoulder: Secondary | ICD-10-CM | POA: Diagnosis not present
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
## 2019-11-15 ENCOUNTER — Other Ambulatory Visit: Payer: Self-pay

## 2019-11-15 ENCOUNTER — Ambulatory Visit: Payer: BC Managed Care – PPO | Attending: Family Medicine | Admitting: Physical Therapy

## 2019-11-15 ENCOUNTER — Encounter: Payer: Self-pay | Admitting: Physical Therapy

## 2019-11-15 DIAGNOSIS — M25512 Pain in left shoulder: Secondary | ICD-10-CM | POA: Diagnosis not present

## 2019-11-15 DIAGNOSIS — M25612 Stiffness of left shoulder, not elsewhere classified: Secondary | ICD-10-CM | POA: Insufficient documentation
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
## 2019-11-24 ENCOUNTER — Ambulatory Visit: Payer: BC Managed Care – PPO | Admitting: Internal Medicine

## 2019-11-24 ENCOUNTER — Other Ambulatory Visit: Payer: Self-pay

## 2019-11-24 ENCOUNTER — Encounter: Payer: Self-pay | Admitting: Internal Medicine

## 2019-11-24 VITALS — BP 150/80 | HR 90 | Resp 12 | Ht 69.0 in | Wt 224.0 lb

## 2019-11-24 DIAGNOSIS — R002 Palpitations: Secondary | ICD-10-CM | POA: Diagnosis not present

## 2019-11-24 DIAGNOSIS — E118 Type 2 diabetes mellitus with unspecified complications: Secondary | ICD-10-CM

## 2019-11-24 DIAGNOSIS — H269 Unspecified cataract: Secondary | ICD-10-CM | POA: Diagnosis not present

## 2019-11-24 DIAGNOSIS — I10 Essential (primary) hypertension: Secondary | ICD-10-CM

## 2019-11-24 DIAGNOSIS — M79672 Pain in left foot: Secondary | ICD-10-CM

## 2019-11-24 DIAGNOSIS — Z114 Encounter for screening for human immunodeficiency virus [HIV]: Secondary | ICD-10-CM

## 2019-11-24 DIAGNOSIS — R252 Cramp and spasm: Secondary | ICD-10-CM

## 2019-11-24 DIAGNOSIS — M79671 Pain in right foot: Secondary | ICD-10-CM | POA: Diagnosis not present

## 2019-11-24 DIAGNOSIS — Z1211 Encounter for screening for malignant neoplasm of colon: Secondary | ICD-10-CM

## 2019-11-24 DIAGNOSIS — E785 Hyperlipidemia, unspecified: Secondary | ICD-10-CM

## 2019-11-24 NOTE — Patient Instructions (Signed)
Tonic water or tablespoon of yellow mustard or vinegar for leg cramps

## 2019-11-24 NOTE — Progress Notes (Signed)
.     Subjective:    Patient ID: Timothy [name], male   DOB: [birth year], 49 y.o.   MRN: [id number]   HPI   1.  Cataracts:  He did have cataract extraction of right eye.  He is saving up to get the left eye done.    2.  DM:  Has not been eating well with a "cookie fest"  Recently.  Has not been checking sugars.  Not cooking as much as his roommate gets upset with him.   Still drinking Red Bull 2-3 times daily. He does not feel any behavioral counseling will help him with regards to eating better.    3.  Left shoulder:  Done with PT.  Feels he is much better.  Has much improved ROM.    4.  Dyslipidemia:  Perhaps more walking about.    5.  Feet a mess:  Sits for 16 hours daily.  He is working on his gluteal health, so his feet don't go to sleep or burn.  Points to left 5th MTP joint and plantar volar feet in general.  6.  Calf Cramps:  More since more active.  Drinking lots of water.   7.  Hypertension:  BP generally 130/80.  He feels up today as his feet are bothering him.    8.  Arrhythmias:  States his phone monitor catches a rate of up to 191.  Gets clammy.  Lasts maybe 20 minutes to 3 hours.  Can also get HR down to 48 and feels definitively light headed with that.  Cannot say he has chest pressure.  Feels like heart is moving weird in his chest.   ECG was unremarkable last visit in January with complaints of palpitations  Current Meds  Medication Sig  . amLODipine (NORVASC) 5 MG tablet Take 1 tablet (5 mg total) by mouth daily.  Marland Kitchen aspirin EC 81 MG tablet Take 1 tablet (81 mg total) by mouth daily.  Marland Kitchen gabapentin (NEURONTIN) 300 MG capsule 1 three times a day  . glipiZIDE (GLUCOTROL) 5 MG tablet TAKE 1 TABLET BY MOUTH ONCE DAILY BEFORE BREAKFAST  . ibuprofen (ADVIL,MOTRIN) 200 MG tablet Take 200 mg by mouth every 6 (six) hours as needed.  Marland Kitchen lisinopril (ZESTRIL) 20 MG tablet 1 tab by mouth daily with Lisinopril/HCTZ 20-25 mg  . lisinopril-hydrochlorothiazide (ZESTORETIC) 20-25 MG  tablet 1 tab by mouth daily with Lisinopril 20 mg  . metFORMIN (GLUCOPHAGE-XR) 500 MG 24 hr tablet TAKE 1 TABLET BY MOUTH TWICE DAILY WITH MEALS  . Omega-3 Fatty Acids (FISH OIL) 1000 MG CAPS 2 caps by mouth twice daily.  . sildenafil (REVATIO) 20 MG tablet 1 tab by mouth as needed prior to activity   Allergies  Allergen Reactions  . Povidone-Iodine Other (See Comments)    BLISTERS     Review of Systems    Objective:   BP (!) 150/80 (BP Location: Left Arm, Patient Position: Sitting, Cuff Size: Normal)   Pulse 90   Resp 12   Ht 5\' 9"  (1.753 m)   Wt 224 lb (101.6 kg)   BMI 33.08 kg/m   Physical Exam  NAD HEENT:  PERRL, EOMI Neck:  Supple, No adenopathy, no thyromegaly Chest:  CTA CV:  RRR with normal S1 andS2, No S3, S4 or murmur.  No carotid bruits.  Carotid, radial pulses normal and equal.  DP pulses a bit decreased.   Abd:  S, NT, No HSM or mass, + BS LE:  No edema.  Pale and atrophic appearing.  Tender over lateral aspect of left 5th MTP joint without swelling or erythema.  Tender over plantar MTP line of both feet, again no swelling or erythema.    Assessment & Plan   1.  Cataracts:  Right resolved with extraction.  Waiting for funds to treat left.  2.  DM:  Again, encouraged better lifestyle over long haul.  Patient up and down with control, though generally not far above 7.0% with A1C at worst.  A1C  Will continue to encourage counseling to work on eating habits/stress eating.  3.  Left shoulder pain:  Improved with PT.  4.  Dyslipidemia:  Lifestyle management.  LDL at goal without medication, but very low HDL and elevated Trigs.    5.  Loletha Grayer and tachyarrhythmia, with presyncopal symptoms with the former:  ECG previous okay with similar complaint.  BMP, CBC, TSH, free T4.  Cardiology referral.  6.  Essential Hypertension:  Also up and down.  Concerned with his male friend and the stress she causes him.  He is not interested in counseling for this as well.   Denies missing meds.  7.  Leg cramps:  BMP, CBC.  To try tonic water, small amount.  Stay hydrated.  8.  Foot pain:  Does have peripheral neuropathy, but this appears to be something different:  Podiatry referral.  9.  HM:  COVID vaccine complete.  HIV screen.   Referral to GI for screening colonoscopy.

## 2019-11-25 LAB — BASIC METABOLIC PANEL
BUN/Creatinine Ratio: 11 (ref 9–20)
BUN: 13 mg/dL (ref 6–24)
CO2: 28 mmol/L (ref 20–29)
Calcium: 10.2 mg/dL (ref 8.7–10.2)
Chloride: 96 mmol/L (ref 96–106)
Creatinine, Ser: 1.15 mg/dL (ref 0.76–1.27)
GFR calc Af Amer: 86 mL/min/{1.73_m2} (ref >59)
GFR calc non Af Amer: 74 mL/min/{1.73_m2} (ref >59)
Glucose: 148 mg/dL — ABNORMAL HIGH (ref 65–99)
Potassium: 4 mmol/L (ref 3.5–5.2)
Sodium: 139 mmol/L (ref 134–144)

## 2019-11-25 LAB — HIV ANTIBODY (ROUTINE TESTING W REFLEX): HIV Screen 4th Generation wRfx: NONREACTIVE

## 2019-11-25 LAB — CBC WITH DIFFERENTIAL/PLATELET
Basophils Absolute: 0.1 10*3/uL (ref 0.0–0.2)
Basos: 1 %
EOS (ABSOLUTE): 0.1 10*3/uL (ref 0.0–0.4)
Eos: 1 %
Hematocrit: 43.7 % (ref 37.5–51.0)
Hemoglobin: 14.9 g/dL (ref 13.0–17.7)
Immature Grans (Abs): 0 10*3/uL (ref 0.0–0.1)
Immature Granulocytes: 0 %
Lymphocytes Absolute: 1.9 10*3/uL (ref 0.7–3.1)
Lymphs: 18 %
MCH: 28.8 pg (ref 26.6–33.0)
MCHC: 34.1 g/dL (ref 31.5–35.7)
MCV: 85 fL (ref 79–97)
Monocytes Absolute: 0.6 10*3/uL (ref 0.1–0.9)
Monocytes: 6 %
Neutrophils Absolute: 7.7 10*3/uL — ABNORMAL HIGH (ref 1.4–7.0)
Neutrophils: 74 %
Platelets: 268 10*3/uL (ref 150–450)
RBC: 5.17 x10E6/uL (ref 4.14–5.80)
RDW: 13.7 % (ref 11.6–15.4)
WBC: 10.4 10*3/uL (ref 3.4–10.8)

## 2019-11-25 LAB — HGB A1C W/O EAG: Hgb A1c MFr Bld: 7.3 % — ABNORMAL HIGH (ref 4.8–5.6)

## 2019-11-25 LAB — TSH: TSH: 2.4 u[IU]/mL (ref 0.450–4.500)

## 2019-11-25 LAB — T4, FREE: Free T4: 1.45 ng/dL (ref 0.82–1.77)

## 2019-11-29 ENCOUNTER — Encounter: Payer: Self-pay | Admitting: Gastroenterology

## 2019-11-30 ENCOUNTER — Other Ambulatory Visit: Payer: Self-pay

## 2019-11-30 ENCOUNTER — Ambulatory Visit: Payer: BC Managed Care – PPO | Admitting: Cardiology

## 2019-11-30 ENCOUNTER — Telehealth: Payer: Self-pay | Admitting: Radiology

## 2019-11-30 ENCOUNTER — Encounter: Payer: Self-pay | Admitting: Cardiology

## 2019-11-30 VITALS — BP 150/80 | HR 93 | Ht 70.0 in | Wt 239.0 lb

## 2019-11-30 DIAGNOSIS — I10 Essential (primary) hypertension: Secondary | ICD-10-CM

## 2019-11-30 DIAGNOSIS — R55 Syncope and collapse: Secondary | ICD-10-CM

## 2019-11-30 DIAGNOSIS — R002 Palpitations: Secondary | ICD-10-CM | POA: Diagnosis not present

## 2019-11-30 DIAGNOSIS — E785 Hyperlipidemia, unspecified: Secondary | ICD-10-CM

## 2019-11-30 NOTE — Telephone Encounter (Signed)
Enrolled patient for a 14 day Zio monitor to be mailed to patients home.  
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
## 2019-11-30 NOTE — Patient Instructions (Signed)
Medication Instructions:  Your physician recommends that you continue on your current medications as directed. Please refer to the Current Medication list given to you today.  Testing/Procedures: Your physician has requested that you have an echocardiogram. Echocardiography is a painless test that uses sound waves to create images of your heart. It provides your doctor with information about the size and shape of your heart and how well your heart's chambers and valves are working. This procedure takes approximately one hour. There are no restrictions for this procedure.   ZIO XT- Long Term Monitor Instructions   Your physician has requested you wear your ZIO patch monitor 14 days.   This is a single patch monitor.  [employer] supplies one patch monitor per enrollment.  Additional stickers are not available.   Please do not apply patch if you will be having a Nuclear Stress Test, Echocardiogram, Cardiac CT, MRI, or Chest Xray during the time frame you would be wearing the monitor. The patch cannot be worn during these tests.  You cannot remove and re-apply the ZIO XT patch monitor.   Your ZIO patch monitor will be sent USPS Priority mail from [facility] directly to your home address. The monitor may also be mailed to a PO BOX if home delivery is not available.   It may take 3-5 days to receive your monitor after you have been enrolled.   Once you have received you monitor, please review enclosed instructions.  Your monitor has already been registered assigning a specific monitor serial # to you.   Applying the monitor   Shave hair from upper left chest.   Hold abrader disc by orange tab.  Rub abrader in 40 strokes over left upper chest as indicated in your monitor instructions.   Clean area with 4 enclosed alcohol pads .  Use all pads to assure are is cleaned thoroughly.  Let dry.   Apply patch as indicated in monitor instructions.  Patch will be place under collarbone on left side  of chest with arrow pointing upward.   Rub patch adhesive wings for 2 minutes.Remove white label marked "1".  Remove white label marked "2".  Rub patch adhesive wings for 2 additional minutes.   While looking in a mirror, press and release button in center of patch.  A small green light will flash 3-4 times .  This will be your only indicator the monitor has been turned on.     Do not shower for the first 24 hours.  You may shower after the first 24 hours.   Press button if you feel a symptom. You will hear a small click.  Record Date, Time and Symptom in the Patient Log Book.   When you are ready to remove patch, follow instructions on last 2 pages of Patient Log Book.  Stick patch monitor onto last page of Patient Log Book.   Place Patient Log Book in [place] box.  Use locking tab on box and tape box closed securely.  The [employer] has [employer] on it.  Please place in mailbox as soon as possible.  Your physician should have your test results approximately 7 days after the monitor has been mailed back to [facility].   Call [place] at [phone] if you have questions regarding your ZIO XT patch monitor.  Call them immediately if you see an orange light blinking on your monitor.   If your monitor falls off in less than 4 days contact our  Monitor department at [phone].  If your monitor becomes loose or falls off after 4 days call [employer] at [phone] for suggestions on securing your monitor.    Follow-Up: At [facility], you and your health needs are our priority.  As part of our continuing mission to provide you with exceptional heart care, we have created designated Provider Care Teams.  These Care Teams include your primary Cardiologist (physician) and Advanced Practice Providers (APPs -  Physician Assistants and Nurse Practitioners) who all work together to provide you with the care you need, when you need it.  We recommend signing up  for the patient portal called "MyChart".  Sign up information is provided on this After Visit Summary.  MyChart is used to connect with patients for Virtual Visits (Telemedicine).  Patients are able to view lab/test results, encounter notes, upcoming appointments, etc.  Non-urgent messages can be sent to your provider as well.   To learn more about what you can do with MyChart, go to [email or web address].    Your next appointment:   3 month(s)  The format for your next appointment:   In Person  Provider:   Oswaldo Milian, MD   Other Instructions Please check your blood pressure at home daily, write it down.  Call the office of send message via Mychart with the readings in 2 weeks for Dr. Gardiner Rhyme to review.

## 2019-12-01 ENCOUNTER — Other Ambulatory Visit: Payer: Self-pay | Admitting: Internal Medicine

## 2019-12-01 ENCOUNTER — Telehealth: Payer: Self-pay | Admitting: Cardiology

## 2019-12-01 NOTE — Telephone Encounter (Signed)
LMOM to call and schedule echo and 3 mos f/u

## 2019-12-06 ENCOUNTER — Ambulatory Visit (INDEPENDENT_AMBULATORY_CARE_PROVIDER_SITE_OTHER): Payer: BC Managed Care – PPO

## 2019-12-06 DIAGNOSIS — R002 Palpitations: Secondary | ICD-10-CM

## 2019-12-11 NOTE — Addendum Note (Signed)
Addended by: Hinton Dyer on: 12/11/2019 07:53 AM   Modules accepted: Orders

## 2019-12-22 ENCOUNTER — Other Ambulatory Visit: Payer: Self-pay

## 2019-12-22 ENCOUNTER — Ambulatory Visit (HOSPITAL_COMMUNITY): Payer: BC Managed Care – PPO | Attending: Cardiology

## 2019-12-22 DIAGNOSIS — R002 Palpitations: Secondary | ICD-10-CM | POA: Insufficient documentation

## 2019-12-22 MED ORDER — PERFLUTREN LIPID MICROSPHERE
1.0000 mL | INTRAVENOUS | Status: AC | PRN
  Administered 2019-12-22: 1 mL via INTRAVENOUS

## 2019-12-26 ENCOUNTER — Other Ambulatory Visit: Payer: Self-pay

## 2019-12-26 ENCOUNTER — Encounter: Payer: Self-pay | Admitting: Sports Medicine

## 2019-12-26 ENCOUNTER — Ambulatory Visit (INDEPENDENT_AMBULATORY_CARE_PROVIDER_SITE_OTHER): Payer: BC Managed Care – PPO

## 2019-12-26 ENCOUNTER — Ambulatory Visit: Payer: BC Managed Care – PPO | Admitting: Sports Medicine

## 2019-12-26 DIAGNOSIS — M79672 Pain in left foot: Secondary | ICD-10-CM | POA: Diagnosis not present

## 2019-12-26 DIAGNOSIS — M779 Enthesopathy, unspecified: Secondary | ICD-10-CM | POA: Diagnosis not present

## 2019-12-26 DIAGNOSIS — M7742 Metatarsalgia, left foot: Secondary | ICD-10-CM

## 2019-12-26 DIAGNOSIS — M79671 Pain in right foot: Secondary | ICD-10-CM

## 2019-12-26 DIAGNOSIS — M7741 Metatarsalgia, right foot: Secondary | ICD-10-CM

## 2019-12-26 DIAGNOSIS — M2170 Unequal limb length (acquired), unspecified site: Secondary | ICD-10-CM

## 2019-12-26 DIAGNOSIS — M21162 Varus deformity, not elsewhere classified, left knee: Secondary | ICD-10-CM

## 2019-12-26 DIAGNOSIS — M21161 Varus deformity, not elsewhere classified, right knee: Secondary | ICD-10-CM

## 2019-12-26 DIAGNOSIS — E1142 Type 2 diabetes mellitus with diabetic polyneuropathy: Secondary | ICD-10-CM
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
## 2019-12-27 ENCOUNTER — Encounter: Payer: Self-pay | Admitting: Cardiology

## 2019-12-27 ENCOUNTER — Other Ambulatory Visit: Payer: Self-pay

## 2019-12-27 ENCOUNTER — Ambulatory Visit: Payer: BC Managed Care – PPO | Admitting: Cardiology

## 2019-12-27 VITALS — BP 124/68 | HR 103 | Ht 70.0 in | Wt 238.2 lb

## 2019-12-27 DIAGNOSIS — R55 Syncope and collapse: Secondary | ICD-10-CM | POA: Diagnosis not present

## 2019-12-27 DIAGNOSIS — I10 Essential (primary) hypertension: Secondary | ICD-10-CM | POA: Diagnosis not present

## 2019-12-27 DIAGNOSIS — E785 Hyperlipidemia, unspecified: Secondary | ICD-10-CM | POA: Diagnosis not present

## 2019-12-27 DIAGNOSIS — R002 Palpitations: Secondary | ICD-10-CM | POA: Diagnosis not present

## 2019-12-27 MED ORDER — ROSUVASTATIN CALCIUM 5 MG PO TABS
5.0000 mg | ORAL_TABLET | Freq: Every day | ORAL | 3 refills | Status: DC
Start: 2019-12-27 — End: 2020-01-16

## 2019-12-27 NOTE — Patient Instructions (Addendum)
Medication Instructions:  START rosuvastatin (Crestor) 5 mg daily  Follow-Up: At [facility], you and your health needs are our priority.  As part of our continuing mission to provide you with exceptional heart care, we have created designated Provider Care Teams.  These Care Teams include your primary Cardiologist (physician) and Advanced Practice Providers (APPs -  Physician Assistants and Nurse Practitioners) who all work together to provide you with the care you need, when you need it.  We recommend signing up for the patient portal called "MyChart".  Sign up information is provided on this After Visit Summary.  MyChart is used to connect with patients for Virtual Visits (Telemedicine).  Patients are able to view lab/test results, encounter notes, upcoming appointments, etc.  Non-urgent messages can be sent to your provider as well.   To learn more about what you can do with MyChart, go to [email or web address].    Your next appointment:   3 month(s)  The format for your next appointment:   In Person  Provider:   Oswaldo Milian, MD
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
## 2019-12-28 ENCOUNTER — Other Ambulatory Visit: Payer: Self-pay | Admitting: Sports Medicine

## 2019-12-28 DIAGNOSIS — M7742 Metatarsalgia, left foot: Secondary | ICD-10-CM

## 2019-12-28 DIAGNOSIS — M7741 Metatarsalgia, right foot: Secondary | ICD-10-CM

## 2020-01-02 ENCOUNTER — Other Ambulatory Visit: Payer: Self-pay

## 2020-01-02 ENCOUNTER — Ambulatory Visit (INDEPENDENT_AMBULATORY_CARE_PROVIDER_SITE_OTHER): Payer: BC Managed Care – PPO | Admitting: Orthotics

## 2020-01-02 DIAGNOSIS — M7741 Metatarsalgia, right foot: Secondary | ICD-10-CM | POA: Diagnosis not present

## 2020-01-02 DIAGNOSIS — M79671 Pain in right foot: Secondary | ICD-10-CM

## 2020-01-02 DIAGNOSIS — M7742 Metatarsalgia, left foot: Secondary | ICD-10-CM | POA: Diagnosis not present

## 2020-01-02 DIAGNOSIS — M2170 Unequal limb length (acquired), unspecified site: Secondary | ICD-10-CM | POA: Diagnosis not present

## 2020-01-02 DIAGNOSIS — M79672 Pain in left foot: Secondary | ICD-10-CM

## 2020-01-02 NOTE — Progress Notes (Signed)
Patient came into today to be cast for [facility]. Upon recommendation of Dr. Cannon Kettle Patient presents with b/l 1/5 pain; signficant LLD R < L, cavus foot Goals are  Hug arch, offload 1/5 b/l and 3/8 lift R Plan vendor

## 2020-01-03 ENCOUNTER — Other Ambulatory Visit: Payer: Self-pay

## 2020-01-03 ENCOUNTER — Ambulatory Visit (AMBULATORY_SURGERY_CENTER): Payer: Self-pay

## 2020-01-03 VITALS — Ht 70.0 in | Wt 234.4 lb

## 2020-01-03 DIAGNOSIS — Z1211 Encounter for screening for malignant neoplasm of colon: Secondary | ICD-10-CM

## 2020-01-03 MED ORDER — SUTAB 1479-225-188 MG PO TABS: 12.0000 | ORAL_TABLET | ORAL | 0 refills | Status: DC

## 2020-01-03 NOTE — Progress Notes (Signed)
No allergies to soy or egg Pt is not on blood thinners or diet pills Denies issues with sedation/intubation Denies atrial flutter/fib Denies constipation   Emmi instructions given to pt  Pt is aware of Covid safety and care partner requirements.  

## 2020-01-05 ENCOUNTER — Telehealth: Payer: Self-pay | Admitting: Cardiology

## 2020-01-05 NOTE — Telephone Encounter (Signed)
SI/HI are not listed as adverse effect of rosuvastatin but agree with d/c medication.  Do not recommend rechallenging with a different statin unless Dr. Gardiner Rhyme says otherwise.

## 2020-01-05 NOTE — Telephone Encounter (Signed)
Called to speak with patient-patient states the first night he took rosuvastatin he could not go to sleep, did not go to bed until 6am that morning.    The next couple of days he could not sleep, his head space was "dark", he was having very bad dreams.  All of this lead to "dark thoughts".   He states he stopped the medication, has been off for 2 days and all of the symptoms have resolves.   Denies SI/HI at current.   Denies current dreams, he is able to sleep, his head space is no longer in a dark place.   He denies this ever happening before with any medication, but has had hx of SI in the past.    Again, denies SI at current.   Advised to stay off rosuvastatin and would send to MD and pharmD to review and advise on side effects/possible alternatives.   Concern for similar side effects to other statins?

## 2020-01-05 NOTE — Telephone Encounter (Signed)
Patient called to advise Dr. Gardiner Rhyme that he had to stop taking rosuvastatin (CRESTOR) 5 MG tablet because it was messing up his head space, states it was ramping up suicidal ideation. Patient said that the first 4 hours after taking it, the suicidal thoughts was really dark and he was up til 6am. He also stated that he was constantly up and down throughout the night to go to the bathroom, and states that that did not bother him. He said he is open to any alternative medicine that Dr. Gardiner Rhyme has in mind.

## 2020-01-05 NOTE — Telephone Encounter (Signed)
Message sent to MD/RN to advise on statin side effects and alternatives

## 2020-01-08 NOTE — Telephone Encounter (Signed)
Agree with discontinuing rosuvastatin.  Will hold off on starting alternative statin

## 2020-01-11 ENCOUNTER — Encounter: Payer: Self-pay | Admitting: Gastroenterology

## 2020-01-16 ENCOUNTER — Encounter: Payer: Self-pay | Admitting: Cardiology

## 2020-01-16 ENCOUNTER — Ambulatory Visit: Payer: BC Managed Care – PPO | Admitting: Cardiology

## 2020-01-16 ENCOUNTER — Other Ambulatory Visit: Payer: Self-pay

## 2020-01-16 VITALS — BP 124/76 | HR 89 | Ht 70.0 in | Wt 235.8 lb

## 2020-01-16 DIAGNOSIS — I480 Paroxysmal atrial fibrillation: Secondary | ICD-10-CM | POA: Diagnosis not present

## 2020-01-16 DIAGNOSIS — I10 Essential (primary) hypertension: Secondary | ICD-10-CM

## 2020-01-16 DIAGNOSIS — R0683 Snoring: Secondary | ICD-10-CM | POA: Diagnosis not present

## 2020-01-16 DIAGNOSIS — E785 Hyperlipidemia, unspecified: Secondary | ICD-10-CM

## 2020-01-16 DIAGNOSIS — Z79899 Other long term (current) drug therapy: Secondary | ICD-10-CM

## 2020-01-16 MED ORDER — METOPROLOL TARTRATE 25 MG PO TABS
25.0000 mg | ORAL_TABLET | Freq: Two times a day (BID) | ORAL | 3 refills | Status: DC
Start: 2020-01-16 — End: 2020-01-17

## 2020-01-16 MED ORDER — APIXABAN 5 MG PO TABS: 5.0000 mg | ORAL_TABLET | Freq: Two times a day (BID) | ORAL | 3 refills | Status: DC

## 2020-01-16 NOTE — Telephone Encounter (Signed)
Called and spoke with pt, reviewed results and medication changes. Able to schedule pt for appt today at 1:40pm with Dr.Schumann. they will be able to make changes with medications at this appt. Pt verbalized understanding with no other questions at this time. Will make primary RN aware.

## 2020-01-16 NOTE — Patient Instructions (Addendum)
Medication Instructions:  STOP Aspirin START Eliquis 5 mg two times daily START metoprolol tartrate (Lopressor) 25 mg two times daily  *If you need a refill on your cardiac medications before your next appointment, please call your pharmacy*   Lab Work: BMET, CBC today  If you have labs (blood work) drawn today and your tests are completely normal, you will receive your results only by: Marland Kitchen MyChart Message (if you have MyChart) OR . A paper copy in the mail If you have any lab test that is abnormal or we need to change your treatment, we will call you to review the results.   Testing/Procedures: Your physician has recommended that you have a sleep study. This test records several body functions during sleep, including: brain activity, eye movement, oxygen and carbon dioxide blood levels, heart rate and rhythm, breathing rate and rhythm, the flow of air through your mouth and nose, snoring, body muscle movements, and chest and belly movement. --this must be approved by insurance prior to scheduling  Follow-Up: At [facility], you and your health needs are our priority.  As part of our continuing mission to provide you with exceptional heart care, we have created designated Provider Care Teams.  These Care Teams include your primary Cardiologist (physician) and Advanced Practice Providers (APPs -  Physician Assistants and Nurse Practitioners) who all work together to provide you with the care you need, when you need it.  We recommend signing up for the patient portal called "MyChart".  Sign up information is provided on this After Visit Summary.  MyChart is used to connect with patients for Virtual Visits (Telemedicine).  Patients are able to view lab/test results, encounter notes, upcoming appointments, etc.  Non-urgent messages can be sent to your provider as well.   To learn more about what you can do with MyChart, go to [email or web address].    Your next appointment:   3  month(s)  The format for your next appointment:   In Person  Provider:   Oswaldo Milian, MD
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
## 2020-01-16 NOTE — Telephone Encounter (Signed)
Left message to call back  

## 2020-01-16 NOTE — Telephone Encounter (Signed)
Follow up  ° ° °Patient is returning call.  °

## 2020-01-17 ENCOUNTER — Encounter: Payer: Self-pay | Admitting: Gastroenterology

## 2020-01-17 ENCOUNTER — Ambulatory Visit (AMBULATORY_SURGERY_CENTER): Payer: BC Managed Care – PPO | Admitting: Gastroenterology

## 2020-01-17 VITALS — BP 115/74 | HR 76 | Temp 97.8°F | Resp 12 | Ht 70.0 in | Wt 238.0 lb

## 2020-01-17 DIAGNOSIS — D123 Benign neoplasm of transverse colon: Secondary | ICD-10-CM

## 2020-01-17 DIAGNOSIS — Z1211 Encounter for screening for malignant neoplasm of colon: Secondary | ICD-10-CM | POA: Diagnosis present

## 2020-01-17 LAB — BASIC METABOLIC PANEL
BUN/Creatinine Ratio: 7 — ABNORMAL LOW (ref 9–20)
BUN: 7 mg/dL (ref 6–24)
CO2: 28 mmol/L (ref 20–29)
Calcium: 10 mg/dL (ref 8.7–10.2)
Chloride: 92 mmol/L — ABNORMAL LOW (ref 96–106)
Creatinine, Ser: 0.99 mg/dL (ref 0.76–1.27)
GFR calc Af Amer: 103 mL/min/{1.73_m2} (ref >59)
GFR calc non Af Amer: 89 mL/min/{1.73_m2} (ref >59)
Glucose: 139 mg/dL — ABNORMAL HIGH (ref 65–99)
Potassium: 3.6 mmol/L (ref 3.5–5.2)
Sodium: 136 mmol/L (ref 134–144)

## 2020-01-17 LAB — CBC
Hematocrit: 44.8 % (ref 37.5–51.0)
Hemoglobin: 15.1 g/dL (ref 13.0–17.7)
MCH: 28.1 pg (ref 26.6–33.0)
MCHC: 33.7 g/dL (ref 31.5–35.7)
MCV: 83 fL (ref 79–97)
Platelets: 304 10*3/uL (ref 150–450)
RBC: 5.38 x10E6/uL (ref 4.14–5.80)
RDW: 13.5 % (ref 11.6–15.4)
WBC: 11.1 10*3/uL — ABNORMAL HIGH (ref 3.4–10.8)

## 2020-01-17 MED ORDER — SODIUM CHLORIDE 0.9 % IV SOLN: 500.0000 mL | INTRAVENOUS | Status: DC

## 2020-01-17 NOTE — Progress Notes (Signed)
Called to room to assist during endoscopic procedure.  Patient ID and intended procedure confirmed with present staff. Received instructions for my participation in the procedure from the performing physician.  

## 2020-01-17 NOTE — Patient Instructions (Signed)
YOU HAD AN ENDOSCOPIC PROCEDURE TODAY AT [facility]:   Refer to the procedure report that was given to you for any specific questions about what was found during the examination.  If the procedure report does not answer your questions, please call your gastroenterologist to clarify.  If you requested that your care partner not be given the details of your procedure findings, then the procedure report has been included in a sealed envelope for you to review at your convenience later.  **Handouts given on polyps, hemorrhoids, diverticulosis and a high fiber diet**  **May want to add a daily stool bulking agent like metamucil or benefiber**  YOU SHOULD EXPECT: Some feelings of bloating in the abdomen. Passage of more gas than usual.  Walking can help get rid of the air that was put into your GI tract during the procedure and reduce the bloating. If you had a lower endoscopy (such as a colonoscopy or flexible sigmoidoscopy) you may notice spotting of blood in your stool or on the toilet paper. If you underwent a bowel prep for your procedure, you may not have a normal bowel movement for a few days.  Please Note:  You might notice some irritation and congestion in your nose or some drainage.  This is from the oxygen used during your procedure.  There is no need for concern and it should clear up in a day or so.  SYMPTOMS TO REPORT IMMEDIATELY:   Following lower endoscopy (colonoscopy or flexible sigmoidoscopy):  Excessive amounts of blood in the stool  Significant tenderness or worsening of abdominal pains  Swelling of the abdomen that is new, acute  Fever of 100F or higher   For urgent or emergent issues, a gastroenterologist can be reached at any hour by calling [phone]. Do not use MyChart messaging for urgent concerns.    DIET:  We do recommend a small meal at first, but then you may proceed to your regular diet.  Drink plenty of fluids but you should avoid alcoholic  beverages for 24 hours.  ACTIVITY:  You should plan to take it easy for the rest of today and you should NOT DRIVE or use heavy machinery until tomorrow (because of the sedation medicines used during the test).    FOLLOW UP: Our staff will call the number listed on your records 48-72 hours following your procedure to check on you and address any questions or concerns that you may have regarding the information given to you following your procedure. If we do not reach you, we will leave a message.  We will attempt to reach you two times.  During this call, we will ask if you have developed any symptoms of COVID 19. If you develop any symptoms (ie: fever, flu-like symptoms, shortness of breath, cough etc.) before then, please call [phone].  If you test positive for Covid 19 in the 2 weeks post procedure, please call and report this information to [place].    If any biopsies were taken you will be contacted by phone or by letter within the next 1-3 weeks.  Please call us at [phone] if you have not heard about the biopsies in 3 weeks.    SIGNATURES/CONFIDENTIALITY: You and/or your care partner have signed paperwork which will be entered into your electronic medical record.  These signatures attest to the fact that that the information above on your After Visit Summary has been reviewed and is understood.  Full responsibility of the confidentiality of this  discharge information lies with you and/or your care-partner. 

## 2020-01-17 NOTE — Op Note (Signed)
[facility] Patient Name: Timothy [name] Procedure Date: 01/17/2020 11:22 AM MRN: [id number] Endoscopist: Thornton Park MD, MD Age: 49 Referring MD:  Date of Birth: 1970-09-28 Gender: Male Account #: [id number] Procedure:                Colonoscopy Indications:              Screening for colorectal malignant neoplasm, This                            is the patient's first colonoscopy                           No known family history of colon cancer or polyps. Medicines:                Monitored Anesthesia Care Procedure:                Pre-Anesthesia Assessment:                           - Prior to the procedure, a History and Physical                            was performed, and patient medications and                            allergies were reviewed. The patient's tolerance of                            previous anesthesia was also reviewed. The risks                            and benefits of the procedure and the sedation                            options and risks were discussed with the patient.                            All questions were answered, and informed consent                            was obtained. Prior Anticoagulants: The patient has                            taken no previous anticoagulant or antiplatelet                            agents. ASA Grade Assessment: II - A patient with                            mild systemic disease. After reviewing the risks                            and benefits, the patient was deemed in  satisfactory condition to undergo the procedure.                           After obtaining informed consent, the colonoscope                            was passed under direct vision. Throughout the                            procedure, the patient's blood pressure, pulse, and                            oxygen saturations were monitored continuously. The                            Colonoscope was  introduced through the anus and                            advanced to the 2 cm into the ileum. The                            colonoscopy was performed without difficulty. The                            patient tolerated the procedure well. The quality                            of the bowel preparation was good. The terminal                            ileum, ileocecal valve, appendiceal orifice, and                            rectum were photographed. Scope In: 11:32:23 AM Scope Out: 11:47:34 AM Scope Withdrawal Time: 0 hours 12 minutes 18 seconds  Total Procedure Duration: 0 hours 15 minutes 11 seconds  Findings:                 Non-bleeding external and internal hemorrhoids were                            found. The hemorrhoids were moderate.                           A 3 mm polyp was found in the proximal transverse                            colon. The polyp was sessile. The polyp was removed                            with a cold snare in a piecemeal technique. Given                            the location to a fold, there was  a small residual                            portion of the polyp that could only be removed                            with cold forceps. Resection and retrieval were                            complete. Estimated blood loss was minimal.                           A few small-mouthed diverticula were found in the                            sigmoid colon.                           The exam was otherwise without abnormality on                            direct and retroflexion views. Complications:            No immediate complications. Estimated blood loss:                            Minimal. Estimated Blood Loss:     Estimated blood loss was minimal. Impression:               - Non-bleeding external and internal hemorrhoids.                           - One 3 mm polyp in the proximal transverse colon,                            removed with a cold snare and  cold biopsy forceps.                            Resected and retrieved.                           - The examination was otherwise normal on direct                            and retroflexion views. Recommendation:           - Patient has a contact number available for                            emergencies. The signs and symptoms of potential                            delayed complications were discussed with the                            patient. Return to normal activities tomorrow.  Written discharge instructions were provided to the                            patient.                           - High fiber diet recommended.                           - Continue present medications.                           - Add a daily stool bulking agent such as Metamucil                            or Benefiber every day.                           - Await pathology results.                           - Repeat colonoscopy date to be determined after                            pending pathology results are reviewed for                            surveillance.                           - Emerging evidence supports eating a diet of                            fruits, vegetables, grains, calcium, and yogurt                            while reducing red meat and alcohol may reduce the                            risk of colon cancer.                           - Thank you for allowing me to be involved in your                            colon cancer prevention. Thornton Park MD, MD 01/17/2020 11:54:36 AM This report has been signed electronically.

## 2020-01-17 NOTE — Progress Notes (Signed)
Vs CW I have reviewed the patient's medical history in detail and updated the computerized patient record.   

## 2020-01-17 NOTE — Progress Notes (Signed)
PT taken to PACU. Monitors in place. VSS. Report given to RN. 

## 2020-01-18 ENCOUNTER — Telehealth: Payer: Self-pay | Admitting: Cardiology

## 2020-01-18 NOTE — Telephone Encounter (Signed)
    Pt returning call to get lab results °

## 2020-01-18 NOTE — Telephone Encounter (Signed)
Stable blood counts and kidney function, OK to start Eliquis as planned. Very mild elevation in WBC count, would inquire about infectious symptoms such as fevers, cough, dysuria  Pt aware of lab results and new directions ./cy

## 2020-01-19 ENCOUNTER — Encounter: Payer: Self-pay | Admitting: Gastroenterology

## 2020-01-19 ENCOUNTER — Telehealth: Payer: Self-pay

## 2020-01-19 ENCOUNTER — Telehealth: Payer: Self-pay | Admitting: *Deleted

## 2020-01-19 NOTE — Telephone Encounter (Signed)
Second follow up call made, left message. 

## 2020-01-19 NOTE — Telephone Encounter (Signed)
Attempted to reach pt. With follow-up call following endoscopic procedure 01/17/2020.  LM on pt. Voice mail.  Will try to reach pt. Again later today.

## 2020-01-23 ENCOUNTER — Telehealth: Payer: Self-pay | Admitting: Cardiology

## 2020-01-23 ENCOUNTER — Other Ambulatory Visit: Payer: Self-pay

## 2020-01-23 ENCOUNTER — Ambulatory Visit: Payer: BC Managed Care – PPO | Admitting: Orthotics

## 2020-01-23 DIAGNOSIS — M79672 Pain in left foot: Secondary | ICD-10-CM

## 2020-01-23 DIAGNOSIS — M79671 Pain in right foot: Secondary | ICD-10-CM

## 2020-01-23 DIAGNOSIS — M7741 Metatarsalgia, right foot: Secondary | ICD-10-CM

## 2020-01-23 NOTE — Progress Notes (Signed)
Patient came in today to pick up custom made foot orthotics.  The goals were accomplished and the patient reported no dissatisfaction with said orthotics.  Patient was advised of breakin period and how to report any issues. 

## 2020-01-23 NOTE — Telephone Encounter (Signed)
Pt c/o medication issue:  1. Name of Medication: metoprolol tartrate (LOPRESSOR) 25 MG  2. How are you currently taking this medication (dosage and times per day)? Pt is not taking   3. Are you having a reaction (difficulty breathing--STAT)? No  4. What is your medication issue? Pt had adverse side effects from taking this medication in the past. The patient wanted to know if there was another medication that Dr. Gardiner Rhyme wanted to put him on

## 2020-01-23 NOTE — Telephone Encounter (Signed)
Left message to call back  

## 2020-01-24 NOTE — Telephone Encounter (Signed)
Could try carvedilol instead, would start at 3.125 mg twice daily

## 2020-01-24 NOTE — Telephone Encounter (Signed)
Called patient- he states that he tried it 6 months ago- and he states it caused him to have no energy and caused him to 'shut down'.  He does not want to take it again, and would like to do something else.  Advised I would route to MD/RN to advise.

## 2020-01-24 NOTE — Telephone Encounter (Signed)
Pt called back returning Hayley's Call

## 2020-01-25 MED ORDER — CARVEDILOL 3.125 MG PO TABS
3.1250 mg | ORAL_TABLET | Freq: Two times a day (BID) | ORAL | 3 refills | Status: DC
Start: 2020-01-25 — End: 2020-02-15

## 2020-01-25 NOTE — Telephone Encounter (Signed)
Patient is returning call.  °

## 2020-01-25 NOTE — Telephone Encounter (Signed)
Left message to call back  

## 2020-01-25 NOTE — Telephone Encounter (Signed)
Pt updated with MD's recommendations and verbalized understanding. New Rx sent to requested pharmacy.

## 2020-01-29 ENCOUNTER — Other Ambulatory Visit: Payer: Self-pay | Admitting: Cardiology

## 2020-01-29 DIAGNOSIS — I10 Essential (primary) hypertension: Secondary | ICD-10-CM

## 2020-01-29 DIAGNOSIS — R0683 Snoring: Secondary | ICD-10-CM

## 2020-01-29 DIAGNOSIS — I480 Paroxysmal atrial fibrillation: Secondary | ICD-10-CM

## 2020-01-30 ENCOUNTER — Other Ambulatory Visit: Payer: Self-pay | Admitting: Cardiology

## 2020-01-30 NOTE — Telephone Encounter (Signed)
spoke with the pharmacy and informed them that they can stop refilling crestor.

## 2020-01-30 NOTE — Telephone Encounter (Signed)
New Message:     Pt says he needs to cx his refills on his Rosuvastatin. The Pharmacist said the office had to cancel this please.

## 2020-02-02 ENCOUNTER — Telehealth: Payer: Self-pay | Admitting: Cardiology

## 2020-02-02 NOTE — Telephone Encounter (Signed)
Pt c/o medication issue:  1. Name of Medication: carvedilol (COREG) 3.125 MG tablet  2. How are you currently taking this medication (dosage and times per day)? Yes, for the past 3 days  3. Are you having a reaction (difficulty breathing--STAT)? SOB but that may have been a result of his sporadic HR and low BP  4. What is your medication issue? Pt says his HR was so low his HR didn't register. His BP was 90/70. Pt said he almost fell yesterday on his way to bed. He rested sporadically and just did not feel well   He does not want to take this medication anymore and is not interested in any new meds this year

## 2020-02-02 NOTE — Telephone Encounter (Signed)
Spoke to pt who voiced he started taking carvedilol on Tuesday then yesterday noticed he felt really tired and couldn't process information while working on his computer. He report he decided to take time away and nap. He voiced while walking to his bed he almost passed out. Pt report 8 hours later he checked BP and it was 90/75 HR 98 and hr later 107/74 HR 94. This morning pt voiced BP is 133/89. HR 85. Pt feels symptoms is related to carvedilol.   Will route to MD for recommendations.

## 2020-02-04 NOTE — Telephone Encounter (Signed)
Can stop carvedilol.  Is he willing to try metoprolol again?  We could start at a low dose 12.5 mg twice daily to see if he tolerates

## 2020-02-09 ENCOUNTER — Telehealth: Payer: Self-pay | Admitting: Cardiology

## 2020-02-09 NOTE — Telephone Encounter (Signed)
Unlikely the nodule has anything to do with Eliquis therapy.   Please continue all medication as prescribed (including Eliquis) and get assessment if needed by PCP.

## 2020-02-09 NOTE — Telephone Encounter (Signed)
Pt c/o medication issue:  1. Name of Medication: apixaban (ELIQUIS) 5 MG TABS tablet  2. How are you currently taking this medication (dosage and times per day)?   3. Are you having a reaction (difficulty breathing--STAT)?   4. What is your medication issue? Patient states he found a nodule on his hand about 3-4 days after he started to taking eliquis.

## 2020-02-09 NOTE — Telephone Encounter (Signed)
Pt updated and verbalized understanding.  

## 2020-02-14 NOTE — Telephone Encounter (Signed)
Spoke to patient-he states he stopped the coreg.  He states he continues to have episode of Afib with high HR.   States Friday or Saturday he felt like episode lasted all day.   Had another episode yesterday that lasted about 45 mins.  States HR was up to 200.   He intially thought this was related to him drinking caffeine but was not the case the last episodes, unsure what the "trigger" is.   He states he tried metoprolol prior to Coreg and he had the same side effects, does not want to take again.   States BP and HR are okay at current-BP 90.    Asymptomatic.    Advised would route to MD for other recommendations and return call.  Patient verbalized understanding.

## 2020-02-14 NOTE — Telephone Encounter (Signed)
Has been unable to tolerate beta-blockers.  Recommend trying diltiazem, but cannot take with amlodipine.  Recommend discontinuing amlodipine and starting diltiazem XR 120 mg daily.

## 2020-02-15 MED ORDER — DILTIAZEM HCL ER COATED BEADS 120 MG PO CP24
120.0000 mg | ORAL_CAPSULE | Freq: Every day | ORAL | 3 refills | Status: DC
Start: 2020-02-15 — End: 2020-06-11

## 2020-02-15 NOTE — Telephone Encounter (Signed)
Left message to call back  

## 2020-02-15 NOTE — Telephone Encounter (Signed)
Patient is returning call.  °

## 2020-02-15 NOTE — Telephone Encounter (Signed)
Patient aware of recommendations and verbalized understanding.   rx sent to pharmacy, med list updated.  Patient to call next week with update or sooner if needed.
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
## 2020-03-24 ENCOUNTER — Other Ambulatory Visit: Payer: Self-pay | Admitting: Internal Medicine

## 2020-03-24 MED ORDER — GLIPIZIDE 5 MG PO TABS: ORAL_TABLET | ORAL | 3 refills | Status: DC

## 2020-03-25 ENCOUNTER — Other Ambulatory Visit (INDEPENDENT_AMBULATORY_CARE_PROVIDER_SITE_OTHER): Payer: BC Managed Care – PPO

## 2020-03-25 DIAGNOSIS — E118 Type 2 diabetes mellitus with unspecified complications: Secondary | ICD-10-CM

## 2020-03-25 DIAGNOSIS — E785 Hyperlipidemia, unspecified: Secondary | ICD-10-CM

## 2020-03-26 ENCOUNTER — Other Ambulatory Visit: Payer: BC Managed Care – PPO

## 2020-03-26 ENCOUNTER — Other Ambulatory Visit: Payer: Self-pay

## 2020-03-26 ENCOUNTER — Ambulatory Visit (INDEPENDENT_AMBULATORY_CARE_PROVIDER_SITE_OTHER): Payer: BC Managed Care – PPO | Admitting: Cardiology

## 2020-03-26 ENCOUNTER — Encounter: Payer: Self-pay | Admitting: Cardiology

## 2020-03-26 VITALS — BP 136/82 | HR 93 | Ht 70.0 in | Wt 230.6 lb

## 2020-03-26 DIAGNOSIS — E785 Hyperlipidemia, unspecified: Secondary | ICD-10-CM

## 2020-03-26 DIAGNOSIS — I480 Paroxysmal atrial fibrillation: Secondary | ICD-10-CM

## 2020-03-26 DIAGNOSIS — R0683 Snoring: Secondary | ICD-10-CM | POA: Diagnosis not present

## 2020-03-26 DIAGNOSIS — I10 Essential (primary) hypertension: Secondary | ICD-10-CM | POA: Diagnosis not present

## 2020-03-26 LAB — LIPID PANEL W/O CHOL/HDL RATIO
Cholesterol, Total: 143 mg/dL (ref 100–199)
HDL: 28 mg/dL — ABNORMAL LOW (ref >39)
LDL Chol Calc (NIH): 87 mg/dL (ref 0–99)
Triglycerides: 162 mg/dL — ABNORMAL HIGH (ref 0–149)
VLDL Cholesterol Cal: 28 mg/dL (ref 5–40)

## 2020-03-26 LAB — HGB A1C W/O EAG: Hgb A1c MFr Bld: 6.7 % — ABNORMAL HIGH (ref 4.8–5.6)

## 2020-03-26 LAB — MICROALBUMIN / CREATININE URINE RATIO
Creatinine, Urine: 148.5 mg/dL
Microalb/Creat Ratio: 4 mg/g creat (ref 0–29)
Microalbumin, Urine: 6 ug/mL

## 2020-03-26 NOTE — Patient Instructions (Signed)
Medication Instructions:  Your physician recommends that you continue on your current medications as directed. Please refer to the Current Medication list given to you today.  *If you need a refill on your cardiac medications before your next appointment, please call your pharmacy*  Testing/Procedures: Please call when you are ready to reschedule the sleep study  Follow-Up: At [facility] you and your health needs are our priority.  As part of our continuing mission to provide you with exceptional heart care, we have created designated Provider Care Teams.  These Care Teams include your primary Cardiologist (physician) and Advanced Practice Providers (APPs -  Physician Assistants and Nurse Practitioners) who all work together to provide you with the care you need, when you need it.  We recommend signing up for the patient portal called "MyChart".  Sign up information is provided on this After Visit Summary.  MyChart is used to connect with patients for Virtual Visits (Telemedicine).  Patients are able to view lab/test results, encounter notes, upcoming appointments, etc.  Non-urgent messages can be sent to your provider as well.   To learn more about what you can do with MyChart, go to [email or web address].    Your next appointment:   6 month(s)  The format for your next appointment:   In Person  Provider:   Oswaldo Milian, MD

## 2020-03-29 ENCOUNTER — Ambulatory Visit: Payer: BC Managed Care – PPO | Admitting: Internal Medicine

## 2020-04-04 ENCOUNTER — Encounter (HOSPITAL_BASED_OUTPATIENT_CLINIC_OR_DEPARTMENT_OTHER): Payer: BC Managed Care – PPO | Admitting: Cardiovascular Disease

## 2020-04-17 ENCOUNTER — Encounter: Payer: Self-pay | Admitting: Internal Medicine

## 2020-04-17 ENCOUNTER — Ambulatory Visit: Payer: BC Managed Care – PPO | Admitting: Internal Medicine

## 2020-04-17 VITALS — BP 148/82 | HR 92 | Resp 14 | Ht 70.0 in | Wt 229.0 lb

## 2020-04-17 DIAGNOSIS — I10 Essential (primary) hypertension: Secondary | ICD-10-CM

## 2020-04-17 DIAGNOSIS — E785 Hyperlipidemia, unspecified: Secondary | ICD-10-CM | POA: Diagnosis not present

## 2020-04-17 DIAGNOSIS — I480 Paroxysmal atrial fibrillation: Secondary | ICD-10-CM

## 2020-04-17 DIAGNOSIS — M720 Palmar fascial fibromatosis [Dupuytren]: Secondary | ICD-10-CM

## 2020-04-17 DIAGNOSIS — E118 Type 2 diabetes mellitus with unspecified complications: Secondary | ICD-10-CM
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
## 2020-04-22 ENCOUNTER — Other Ambulatory Visit: Payer: Self-pay | Admitting: Internal Medicine

## 2020-05-01 ENCOUNTER — Telehealth: Payer: Self-pay | Admitting: Internal Medicine

## 2020-05-01 NOTE — Telephone Encounter (Signed)
Patient called requesting Rx on sildenafil (REVATIO) 20 MG tablet to be called in at [facility] on [address].

## 2020-05-02 ENCOUNTER — Other Ambulatory Visit: Payer: Self-pay | Admitting: Internal Medicine

## 2020-05-02 MED ORDER — SILDENAFIL CITRATE 20 MG PO TABS
ORAL_TABLET | ORAL | 3 refills | Status: DC
Start: 2020-05-02 — End: 2020-09-06

## 2020-05-03 ENCOUNTER — Other Ambulatory Visit: Payer: Self-pay | Admitting: Internal Medicine

## 2020-06-09 ENCOUNTER — Other Ambulatory Visit: Payer: Self-pay | Admitting: Cardiology

## 2020-06-10 NOTE — Telephone Encounter (Signed)
This is Dr. Schumann's pt 

## 2020-07-16 ENCOUNTER — Other Ambulatory Visit: Payer: BC Managed Care – PPO

## 2020-07-19 ENCOUNTER — Encounter: Payer: Self-pay | Admitting: Internal Medicine

## 2020-07-19 ENCOUNTER — Ambulatory Visit: Payer: BC Managed Care – PPO | Admitting: Internal Medicine

## 2020-07-19 ENCOUNTER — Other Ambulatory Visit: Payer: Self-pay

## 2020-07-19 VITALS — BP 140/94 | HR 88 | Resp 18 | Ht 70.0 in | Wt 232.2 lb

## 2020-07-19 DIAGNOSIS — I480 Paroxysmal atrial fibrillation: Secondary | ICD-10-CM | POA: Diagnosis not present

## 2020-07-19 DIAGNOSIS — N529 Male erectile dysfunction, unspecified: Secondary | ICD-10-CM

## 2020-07-19 DIAGNOSIS — E785 Hyperlipidemia, unspecified: Secondary | ICD-10-CM

## 2020-07-19 DIAGNOSIS — Z79899 Other long term (current) drug therapy: Secondary | ICD-10-CM

## 2020-07-19 DIAGNOSIS — Z113 Encounter for screening for infections with a predominantly sexual mode of transmission: Secondary | ICD-10-CM

## 2020-07-19 DIAGNOSIS — E118 Type 2 diabetes mellitus with unspecified complications: Secondary | ICD-10-CM

## 2020-07-19 DIAGNOSIS — I10 Essential (primary) hypertension: Secondary | ICD-10-CM | POA: Diagnosis not present

## 2020-07-19 DIAGNOSIS — R5383 Other fatigue: Secondary | ICD-10-CM

## 2020-07-19 NOTE — Progress Notes (Unsigned)
° ° °  Subjective:    Patient ID: Timothy [name], male   DOB: [birth year], 50 y.o.   MRN: [id number]   HPI   1.  PAF:  Had one episode of palpitations since his roommate left.  Had a very stressful 2 weeks following her leaving as his mother died and had home repair issues followed by dental infection and also finally had left cataract extraction the following month in December.   Continues on Eliquis and Diltiazem.  2.  DM:  Not checking sugars.  When he has checked, obtaining 160s.  States he is eating better, but then describes not eating well.  Perhaps more physically active as he is cleaning/repairing his home since   3.  Hypertension:  Has been fine when checked elsewhere, with diastolics in 85Y.    Current Meds  Medication Sig   apixaban (ELIQUIS) 5 MG TABS tablet Take 1 tablet (5 mg total) by mouth 2 (two) times daily.   diltiazem (CARDIZEM CD) 120 MG 24 hr capsule TAKE 1 CAPSULE BY MOUTH ONCE DAILY **STOP AMLODIPINE**   gabapentin (NEURONTIN) 300 MG capsule 1 three times a day   glipiZIDE (GLUCOTROL) 5 MG tablet 1 tab by mouth daily with breakfast   ibuprofen (ADVIL,MOTRIN) 200 MG tablet Take 200 mg by mouth every 6 (six) hours as needed.   lisinopril (ZESTRIL) 20 MG tablet TAKE 1 TABLET BY MOUTH ONCE DAILY WITH  LISINOPRIL/  HCTZ  20-25  MG   lisinopril-hydrochlorothiazide (ZESTORETIC) 20-25 MG tablet TAKE 1 TABLET BY MOUTH ONCE DAILY WITH  LISINOPRIL  20  MG   metFORMIN (GLUCOPHAGE-XR) 500 MG 24 hr tablet TAKE 1 TABLET BY MOUTH TWICE DAILY WITH MEALS   sildenafil (REVATIO) 20 MG tablet TAKE ONE TABLET BY MOUTH  AS NEEDED PRIOR TO ACTIVITY   Allergies  Allergen Reactions   Povidone-Iodine Other (See Comments)    BLISTERS   Statins     Insomnia, nightmares     Review of Systems    Objective:   BP (!) 140/94 (BP Location: Left Arm, Patient Position: Sitting, Cuff Size: Normal)    Pulse 88    Resp 18    Ht 5\' 10"  (1.778 m)    Wt 232 lb 4 oz (105.3 kg)    BMI  33.32 kg/m   Physical Exam   Assessment & Plan

## 2020-07-20 DIAGNOSIS — R5383 Other fatigue: Secondary | ICD-10-CM | POA: Insufficient documentation

## 2020-07-20 DIAGNOSIS — I480 Paroxysmal atrial fibrillation: Secondary | ICD-10-CM | POA: Insufficient documentation

## 2020-07-20 LAB — HIV ANTIBODY (ROUTINE TESTING W REFLEX): HIV Screen 4th Generation wRfx: NONREACTIVE

## 2020-07-20 LAB — BASIC METABOLIC PANEL
BUN/Creatinine Ratio: 12 (ref 9–20)
BUN: 14 mg/dL (ref 6–24)
CO2: 27 mmol/L (ref 20–29)
Calcium: 9.5 mg/dL (ref 8.7–10.2)
Chloride: 95 mmol/L — ABNORMAL LOW (ref 96–106)
Creatinine, Ser: 1.15 mg/dL (ref 0.76–1.27)
GFR calc Af Amer: 85 mL/min/{1.73_m2} (ref >59)
GFR calc non Af Amer: 74 mL/min/{1.73_m2} (ref >59)
Glucose: 157 mg/dL — ABNORMAL HIGH (ref 65–99)
Potassium: 4.1 mmol/L (ref 3.5–5.2)
Sodium: 137 mmol/L (ref 134–144)

## 2020-07-20 LAB — LIPID PANEL W/O CHOL/HDL RATIO
Cholesterol, Total: 143 mg/dL (ref 100–199)
HDL: 29 mg/dL — ABNORMAL LOW (ref >39)
LDL Chol Calc (NIH): 87 mg/dL (ref 0–99)
Triglycerides: 150 mg/dL — ABNORMAL HIGH (ref 0–149)
VLDL Cholesterol Cal: 27 mg/dL (ref 5–40)

## 2020-07-20 LAB — HGB A1C W/O EAG: Hgb A1c MFr Bld: 7.7 % — ABNORMAL HIGH (ref 4.8–5.6)

## 2020-07-20 LAB — HEPATITIS C ANTIBODY: Hep C Virus Ab: <0.1 s/co ratio (ref 0.0–0.9)

## 2020-07-20 LAB — RPR QUALITATIVE: RPR Ser Ql: NONREACTIVE

## 2020-07-21 LAB — GC/CHLAMYDIA PROBE AMP
Chlamydia trachomatis, NAA: NEGATIVE
Neisseria Gonorrhoeae by PCR: NEGATIVE

## 2020-07-26 LAB — SPECIMEN STATUS REPORT

## 2020-07-26 LAB — TESTOSTERONE: Testosterone: 303 ng/dL (ref 264–916)

## 2020-09-03 HISTORY — PX: DENTAL RESTORATION/EXTRACTION WITH X-RAY: SHX5796

## 2020-09-06 ENCOUNTER — Other Ambulatory Visit: Payer: Self-pay | Admitting: Internal Medicine

## 2020-09-06 MED ORDER — SILDENAFIL CITRATE 20 MG PO TABS: ORAL_TABLET | ORAL | 3 refills | Status: DC

## 2020-09-06 NOTE — Progress Notes (Signed)
Fax requesting refill of Sildenafil

## 2020-09-10 ENCOUNTER — Encounter (HOSPITAL_COMMUNITY): Payer: Self-pay

## 2020-09-10 ENCOUNTER — Other Ambulatory Visit: Payer: Self-pay

## 2020-09-10 ENCOUNTER — Emergency Department (HOSPITAL_COMMUNITY)
Admission: EM | Admit: 2020-09-10 | Discharge: 2020-09-10 | Disposition: A | Discharge status: Home / Self Care | Payer: BC Managed Care – PPO | Attending: Emergency Medicine | Admitting: Emergency Medicine

## 2020-09-10 DIAGNOSIS — I10 Essential (primary) hypertension: Secondary | ICD-10-CM | POA: Insufficient documentation

## 2020-09-10 DIAGNOSIS — K068 Other specified disorders of gingiva and edentulous alveolar ridge: Secondary | ICD-10-CM | POA: Insufficient documentation

## 2020-09-10 DIAGNOSIS — E1140 Type 2 diabetes mellitus with diabetic neuropathy, unspecified: Secondary | ICD-10-CM | POA: Insufficient documentation

## 2020-09-10 DIAGNOSIS — Z7984 Long term (current) use of oral hypoglycemic drugs: Secondary | ICD-10-CM | POA: Diagnosis not present

## 2020-09-10 DIAGNOSIS — Z7901 Long term (current) use of anticoagulants: Secondary | ICD-10-CM | POA: Diagnosis not present

## 2020-09-10 DIAGNOSIS — Z79899 Other long term (current) drug therapy: Secondary | ICD-10-CM | POA: Diagnosis not present

## 2020-09-10 MED ORDER — TRANEXAMIC ACID FOR EPISTAXIS
500.0000 mg | Freq: Once | TOPICAL | Status: AC
  Administered 2020-09-10: 500 mg via TOPICAL
  Filled 2020-09-10: qty 10

## 2020-09-10 NOTE — ED Triage Notes (Signed)
Pt reports that he had his teeth removed on Thursday and tonight a stitch popped up out on the upper R side and he is been having bleeding, on Eliquis, minimal bleeding.
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
## 2020-09-24 ENCOUNTER — Other Ambulatory Visit: Payer: Self-pay

## 2020-09-24 ENCOUNTER — Ambulatory Visit: Payer: BC Managed Care – PPO | Admitting: Cardiology

## 2020-09-24 ENCOUNTER — Encounter: Payer: Self-pay | Admitting: Cardiology

## 2020-09-24 VITALS — BP 127/73 | HR 83 | Ht 70.0 in | Wt 240.6 lb

## 2020-09-24 DIAGNOSIS — I480 Paroxysmal atrial fibrillation: Secondary | ICD-10-CM | POA: Diagnosis not present

## 2020-09-24 DIAGNOSIS — E785 Hyperlipidemia, unspecified: Secondary | ICD-10-CM

## 2020-09-24 DIAGNOSIS — R0683 Snoring: Secondary | ICD-10-CM

## 2020-09-24 DIAGNOSIS — I10 Essential (primary) hypertension: Secondary | ICD-10-CM | POA: Diagnosis not present

## 2020-09-24 NOTE — Patient Instructions (Addendum)
Medication Instructions:  Your physician recommends that you continue on your current medications as directed. Please refer to the Current Medication list given to you today.  *If you need a refill on your cardiac medications before your next appointment, please call your pharmacy*  Testing/Procedures: Your physician has recommended that you have a home sleep study. This test records several body functions during sleep, including: brain activity, eye movement, oxygen and carbon dioxide blood levels, heart rate and rhythm, breathing rate and rhythm, the flow of air through your mouth and nose, snoring, body muscle movements, and chest and belly movement. --this must be approved by insurance prior to scheduling  CT coronary calcium score. This test is done at [address]. This is $99 out of pocket.   Coronary CalciumScan A coronary calcium scan is an imaging test used to look for deposits of calcium and other fatty materials (plaques) in the inner lining of the blood vessels of the heart (coronary arteries). These deposits of calcium and plaques can partly clog and narrow the coronary arteries without producing any symptoms or warning signs. This puts a person at risk for a heart attack. This test can detect these deposits before symptoms develop. Tell a health care provider about:  Any allergies you have.  All medicines you are taking, including vitamins, herbs, eye drops, creams, and over-the-counter medicines.  Any problems you or family members have had with anesthetic medicines.  Any blood disorders you have.  Any surgeries you have had.  Any medical conditions you have.  Whether you are pregnant or may be pregnant. What are the risks? Generally, this is a safe procedure. However, problems may occur, including:  Harm to a pregnant woman and her unborn baby. This test involves the use of radiation. Radiation exposure can be dangerous to a pregnant woman and her  unborn baby. If you are pregnant, you generally should not have this procedure done.  Slight increase in the risk of cancer. This is because of the radiation involved in the test. What happens before the procedure? No preparation is needed for this procedure. What happens during the procedure?  You will undress and remove any jewelry around your neck or chest.  You will put on a hospital gown.  Sticky electrodes will be placed on your chest. The electrodes will be connected to an electrocardiogram (ECG) machine to record a tracing of the electrical activity of your heart.  A CT scanner will take pictures of your heart. During this time, you will be asked to lie still and hold your breath for 2-3 seconds while a picture of your heart is being taken. The procedure may vary among health care providers and hospitals. What happens after the procedure?  You can get dressed.  You can return to your normal activities.  It is up to you to get the results of your test. Ask your health care provider, or the department that is doing the test, when your results will be ready. Summary  A coronary calcium scan is an imaging test used to look for deposits of calcium and other fatty materials (plaques) in the inner lining of the blood vessels of the heart (coronary arteries).  Generally, this is a safe procedure. Tell your health care provider if you are pregnant or may be pregnant.  No preparation is needed for this procedure.  A CT scanner will take pictures of your heart.  You can return to your normal activities after the scan is  done. This information is not intended to replace advice given to you by your health care provider. Make sure you discuss any questions you have with your health care provider. Document Released: 12/19/2007 Document Revised: 05/11/2016 Document Reviewed: 05/11/2016 Elsevier Interactive Patient Education  2017 [place]: At [facility], you and  your health needs are our priority.  As part of our continuing mission to provide you with exceptional heart care, we have created designated Provider Care Teams.  These Care Teams include your primary Cardiologist (physician) and Advanced Practice Providers (APPs -  Physician Assistants and Nurse Practitioners) who all work together to provide you with the care you need, when you need it.  We recommend signing up for the patient portal called "MyChart".  Sign up information is provided on this After Visit Summary.  MyChart is used to connect with patients for Virtual Visits (Telemedicine).  Patients are able to view lab/test results, encounter notes, upcoming appointments, etc.  Non-urgent messages can be sent to your provider as well.   To learn more about what you can do with MyChart, go to [email or web address].    Your next appointment:   6 month(s)  The format for your next appointment:   In Person  Provider:   Oswaldo Milian, MD

## 2020-09-25 ENCOUNTER — Telehealth: Payer: Self-pay | Admitting: Cardiology

## 2020-09-25 NOTE — Telephone Encounter (Signed)
Spoke with patient regarding Tuesday 10/08/20 11:30 am calcium score appointment--arrival time is 11:15 am--[address] for check in.  Will mail information to patient and is is available in My Chart.  Patient voiced his understanding

## 2020-09-25 NOTE — Telephone Encounter (Signed)
HST APPROVED, ORDER ID [id number] VALID THROUGH 09/25/20 - 11/23/20   Patient scheduled for 05/09 at 11:30am and is aware of appt.

## 2020-09-27 ENCOUNTER — Ambulatory Visit (INDEPENDENT_AMBULATORY_CARE_PROVIDER_SITE_OTHER): Payer: BC Managed Care – PPO | Admitting: Family Medicine

## 2020-09-27 ENCOUNTER — Other Ambulatory Visit: Payer: Self-pay

## 2020-09-27 ENCOUNTER — Encounter: Payer: Self-pay | Admitting: Family Medicine

## 2020-09-27 DIAGNOSIS — M25511 Pain in right shoulder: Secondary | ICD-10-CM | POA: Diagnosis not present

## 2020-09-27 MED ORDER — OXYCODONE-ACETAMINOPHEN 7.5-325 MG PO TABS: 1.0000 | ORAL_TABLET | Freq: Four times a day (QID) | ORAL | 0 refills | Status: DC | PRN
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
## 2020-10-02 ENCOUNTER — Telehealth: Payer: Self-pay | Admitting: Internal Medicine

## 2020-10-02 MED ORDER — GABAPENTIN 300 MG PO CAPS: ORAL_CAPSULE | ORAL | 11 refills | Status: DC

## 2020-10-02 NOTE — Telephone Encounter (Signed)
Patient called requesting a refill on gabapentin (NEURONTIN) 300 MG capsule

## 2020-10-08 ENCOUNTER — Ambulatory Visit (INDEPENDENT_AMBULATORY_CARE_PROVIDER_SITE_OTHER)
Admission: RE | Admit: 2020-10-08 | Discharge: 2020-10-08 | Disposition: A | Discharge status: Home / Self Care | Payer: Self-pay | Source: Ambulatory Visit | Attending: Cardiology | Admitting: Cardiology

## 2020-10-08 ENCOUNTER — Other Ambulatory Visit: Payer: Self-pay

## 2020-10-08 DIAGNOSIS — E785 Hyperlipidemia, unspecified: Secondary | ICD-10-CM

## 2020-10-10 NOTE — Telephone Encounter (Signed)
Called patient and confirmed that he was able to pick up his Rx.

## 2020-11-11 ENCOUNTER — Other Ambulatory Visit: Payer: Self-pay

## 2020-11-11 ENCOUNTER — Ambulatory Visit (HOSPITAL_BASED_OUTPATIENT_CLINIC_OR_DEPARTMENT_OTHER): Payer: BC Managed Care – PPO | Admitting: Cardiovascular Disease

## 2020-11-11 DIAGNOSIS — I480 Paroxysmal atrial fibrillation: Secondary | ICD-10-CM

## 2020-11-11 DIAGNOSIS — R0683 Snoring: Secondary | ICD-10-CM

## 2020-11-11 DIAGNOSIS — I10 Essential (primary) hypertension: Secondary | ICD-10-CM

## 2020-11-18 ENCOUNTER — Ambulatory Visit (HOSPITAL_BASED_OUTPATIENT_CLINIC_OR_DEPARTMENT_OTHER): Payer: BC Managed Care – PPO | Attending: Cardiology | Admitting: Cardiovascular Disease

## 2020-11-18 ENCOUNTER — Other Ambulatory Visit: Payer: Self-pay

## 2020-11-20 ENCOUNTER — Telehealth: Payer: Self-pay | Admitting: *Deleted

## 2020-11-20 NOTE — Telephone Encounter (Signed)
Received a call from Jacolyn Reedy, RT at [facility] me the patient has attempted and failed the HST twice. He does not wish to do a in lab sleep study. The ordering provider, Dr. Gardiner Rhyme  will be notified for review and recommendations.

## 2020-11-21 NOTE — Telephone Encounter (Signed)
We can hold off on sleep study

## 2020-12-03 ENCOUNTER — Encounter: Payer: BC Managed Care – PPO | Admitting: Internal Medicine

## 2020-12-31 ENCOUNTER — Other Ambulatory Visit: Payer: Self-pay | Admitting: Internal Medicine

## 2020-12-31 MED ORDER — SILDENAFIL CITRATE 20 MG PO TABS: ORAL_TABLET | ORAL | 10 refills | Status: DC

## 2021-01-15 ENCOUNTER — Other Ambulatory Visit: Payer: Self-pay | Admitting: Orthopedic Surgery

## 2021-01-15 DIAGNOSIS — M25511 Pain in right shoulder: Secondary | ICD-10-CM

## 2021-01-24 ENCOUNTER — Other Ambulatory Visit: Payer: Self-pay

## 2021-01-24 ENCOUNTER — Other Ambulatory Visit: Payer: BC Managed Care – PPO

## 2021-01-24 ENCOUNTER — Ambulatory Visit
Admission: RE | Admit: 2021-01-24 | Discharge: 2021-01-24 | Disposition: A | Discharge status: Home / Self Care | Payer: BC Managed Care – PPO | Source: Ambulatory Visit | Attending: Orthopedic Surgery | Admitting: Orthopedic Surgery

## 2021-01-24 DIAGNOSIS — M25511 Pain in right shoulder: Secondary | ICD-10-CM

## 2021-02-09 ENCOUNTER — Other Ambulatory Visit: Payer: Self-pay | Admitting: Cardiology

## 2021-02-10 NOTE — Telephone Encounter (Signed)
Eliquis mg refill request received. Patient is 50 years old, weight-108.9 kg, Crea-1.15, and last seen by MS on 09/24/20. Dose is appropriate based on dosing criteria. Will send in refill to requested pharmacy.

## 2021-03-14 ENCOUNTER — Other Ambulatory Visit: Payer: Self-pay

## 2021-03-14 ENCOUNTER — Encounter: Payer: Self-pay | Admitting: Emergency Medicine

## 2021-03-14 ENCOUNTER — Ambulatory Visit
Admission: EM | Admit: 2021-03-14 | Discharge: 2021-03-14 | Disposition: A | Discharge status: Home / Self Care | Payer: BC Managed Care – PPO | Attending: Physician Assistant | Admitting: Physician Assistant

## 2021-03-14 DIAGNOSIS — L03115 Cellulitis of right lower limb: Secondary | ICD-10-CM

## 2021-03-14 DIAGNOSIS — L0103 Bullous impetigo: Secondary | ICD-10-CM | POA: Diagnosis not present

## 2021-03-14 MED ORDER — MUPIROCIN 2 % EX OINT: 1.0000 "application " | TOPICAL_OINTMENT | Freq: Every day | CUTANEOUS | 0 refills | Status: DC

## 2021-03-14 MED ORDER — CEPHALEXIN 500 MG PO CAPS: 500.0000 mg | ORAL_CAPSULE | Freq: Three times a day (TID) | ORAL | 0 refills | Status: DC

## 2021-03-14 NOTE — Discharge Instructions (Addendum)
Start Keflex 3 times a day for a week.  Use Bactroban ointment on wounds twice daily.  Monitor area of redness and if this is not improving with antibiotic use you need to be reevaluated.  If you develop any additional symptoms including fever, nausea, vomiting, body aches, spread of redness you need to be seen immediately.  Follow-up with your primary care provider next week as we discussed to ensure improvement of symptoms.
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
## 2021-03-14 NOTE — ED Triage Notes (Signed)
Skin lesion to inner right ankle with blister formation beginning last night. States the last time this happened he had a staph infection.

## 2021-03-18 ENCOUNTER — Other Ambulatory Visit: Payer: Self-pay

## 2021-03-18 ENCOUNTER — Ambulatory Visit (INDEPENDENT_AMBULATORY_CARE_PROVIDER_SITE_OTHER): Payer: BC Managed Care – PPO | Admitting: Internal Medicine

## 2021-03-18 ENCOUNTER — Encounter: Payer: Self-pay | Admitting: Internal Medicine

## 2021-03-18 ENCOUNTER — Other Ambulatory Visit: Payer: Self-pay | Admitting: Internal Medicine

## 2021-03-18 VITALS — BP 136/82 | HR 76 | Resp 12 | Ht 67.75 in | Wt 232.0 lb

## 2021-03-18 DIAGNOSIS — I10 Essential (primary) hypertension: Secondary | ICD-10-CM

## 2021-03-18 DIAGNOSIS — E785 Hyperlipidemia, unspecified: Secondary | ICD-10-CM

## 2021-03-18 DIAGNOSIS — Z0000 Encounter for general adult medical examination without abnormal findings: Secondary | ICD-10-CM

## 2021-03-18 DIAGNOSIS — E118 Type 2 diabetes mellitus with unspecified complications: Secondary | ICD-10-CM

## 2021-03-18 DIAGNOSIS — Z125 Encounter for screening for malignant neoplasm of prostate: Secondary | ICD-10-CM

## 2021-03-18 DIAGNOSIS — Z79899 Other long term (current) drug therapy: Secondary | ICD-10-CM

## 2021-03-18 MED ORDER — METFORMIN HCL ER 500 MG PO TB24: 500.0000 mg | ORAL_TABLET | Freq: Two times a day (BID) | ORAL | 3 refills | Status: DC

## 2021-03-18 MED ORDER — DILTIAZEM HCL ER COATED BEADS 120 MG PO CP24: 120.0000 mg | ORAL_CAPSULE | Freq: Every day | ORAL | 3 refills | Status: DC

## 2021-03-18 MED ORDER — GABAPENTIN 300 MG PO CAPS: ORAL_CAPSULE | ORAL | 3 refills | Status: DC

## 2021-03-18 MED ORDER — LISINOPRIL 20 MG PO TABS: ORAL_TABLET | ORAL | 3 refills | Status: DC

## 2021-03-18 MED ORDER — GLIPIZIDE ER 5 MG PO TB24: 5.0000 mg | ORAL_TABLET | Freq: Every day | ORAL | 3 refills | Status: DC

## 2021-03-18 MED ORDER — LISINOPRIL-HYDROCHLOROTHIAZIDE 20-25 MG PO TABS: ORAL_TABLET | ORAL | 3 refills | Status: DC
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
## 2021-03-18 NOTE — Patient Instructions (Signed)
Metta Clines [phone] Can go online to her website as well. Not sure if accepting new patients

## 2021-03-19 LAB — CBC WITH DIFFERENTIAL/PLATELET
Basophils Absolute: 0.1 10*3/uL (ref 0.0–0.2)
Basos: 1 %
EOS (ABSOLUTE): 0.3 10*3/uL (ref 0.0–0.4)
Eos: 3 %
Hematocrit: 46.5 % (ref 37.5–51.0)
Hemoglobin: 15.2 g/dL (ref 13.0–17.7)
Immature Grans (Abs): 0 10*3/uL (ref 0.0–0.1)
Immature Granulocytes: 0 %
Lymphocytes Absolute: 1.9 10*3/uL (ref 0.7–3.1)
Lymphs: 21 %
MCH: 27.4 pg (ref 26.6–33.0)
MCHC: 32.7 g/dL (ref 31.5–35.7)
MCV: 84 fL (ref 79–97)
Monocytes Absolute: 0.7 10*3/uL (ref 0.1–0.9)
Monocytes: 7 %
Neutrophils Absolute: 6.3 10*3/uL (ref 1.4–7.0)
Neutrophils: 68 %
Platelets: 277 10*3/uL (ref 150–450)
RBC: 5.55 x10E6/uL (ref 4.14–5.80)
RDW: 15.8 % — ABNORMAL HIGH (ref 11.6–15.4)
WBC: 9.3 10*3/uL (ref 3.4–10.8)

## 2021-03-19 LAB — COMPREHENSIVE METABOLIC PANEL
ALT: 46 IU/L — ABNORMAL HIGH (ref 0–44)
AST: 38 IU/L (ref 0–40)
Albumin/Globulin Ratio: 1.7 (ref 1.2–2.2)
Albumin: 4.5 g/dL (ref 4.0–5.0)
Alkaline Phosphatase: 95 IU/L (ref 44–121)
BUN/Creatinine Ratio: 16 (ref 9–20)
BUN: 16 mg/dL (ref 6–24)
Bilirubin Total: 0.4 mg/dL (ref 0.0–1.2)
CO2: 26 mmol/L (ref 20–29)
Calcium: 9.6 mg/dL (ref 8.7–10.2)
Chloride: 96 mmol/L (ref 96–106)
Creatinine, Ser: 1.03 mg/dL (ref 0.76–1.27)
Globulin, Total: 2.7 g/dL (ref 1.5–4.5)
Glucose: 157 mg/dL — ABNORMAL HIGH (ref 65–99)
Potassium: 4 mmol/L (ref 3.5–5.2)
Sodium: 138 mmol/L (ref 134–144)
Total Protein: 7.2 g/dL (ref 6.0–8.5)
eGFR: 88 mL/min/{1.73_m2} (ref >59)

## 2021-03-19 LAB — MICROALBUMIN / CREATININE URINE RATIO
Creatinine, Urine: 160.1 mg/dL
Microalb/Creat Ratio: 16 mg/g creat (ref 0–29)
Microalbumin, Urine: 26.1 ug/mL

## 2021-03-19 LAB — HGB A1C W/O EAG: Hgb A1c MFr Bld: 8.2 % — ABNORMAL HIGH (ref 4.8–5.6)

## 2021-03-19 LAB — LIPID PANEL W/O CHOL/HDL RATIO
Cholesterol, Total: 144 mg/dL (ref 100–199)
HDL: 26 mg/dL — ABNORMAL LOW (ref >39)
LDL Chol Calc (NIH): 81 mg/dL (ref 0–99)
Triglycerides: 218 mg/dL — ABNORMAL HIGH (ref 0–149)
VLDL Cholesterol Cal: 37 mg/dL (ref 5–40)

## 2021-03-19 LAB — PSA: Prostate Specific Ag, Serum: 1 ng/mL (ref 0.0–4.0)
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
## 2021-04-02 ENCOUNTER — Encounter: Payer: Self-pay | Admitting: Cardiology

## 2021-04-02 ENCOUNTER — Ambulatory Visit (INDEPENDENT_AMBULATORY_CARE_PROVIDER_SITE_OTHER): Payer: BC Managed Care – PPO | Admitting: Cardiology

## 2021-04-02 ENCOUNTER — Other Ambulatory Visit: Payer: Self-pay

## 2021-04-02 VITALS — BP 143/91 | HR 86 | Ht 70.0 in | Wt 244.0 lb

## 2021-04-02 DIAGNOSIS — E785 Hyperlipidemia, unspecified: Secondary | ICD-10-CM

## 2021-04-02 DIAGNOSIS — R0683 Snoring: Secondary | ICD-10-CM

## 2021-04-02 DIAGNOSIS — I480 Paroxysmal atrial fibrillation: Secondary | ICD-10-CM

## 2021-04-02 DIAGNOSIS — I10 Essential (primary) hypertension: Secondary | ICD-10-CM | POA: Diagnosis not present

## 2021-04-02 NOTE — Patient Instructions (Signed)
Medication Instructions:  Your physician recommends that you continue on your current medications as directed. Please refer to the Current Medication list given to you today.  *If you need a refill on your cardiac medications before your next appointment, please call your pharmacy*  Follow-Up: At [facility], you and your health needs are our priority.  As part of our continuing mission to provide you with exceptional heart care, we have created designated Provider Care Teams.  These Care Teams include your primary Cardiologist (physician) and Advanced Practice Providers (APPs -  Physician Assistants and Nurse Practitioners) who all work together to provide you with the care you need, when you need it.  We recommend signing up for the patient portal called "MyChart".  Sign up information is provided on this After Visit Summary.  MyChart is used to connect with patients for Virtual Visits (Telemedicine).  Patients are able to view lab/test results, encounter notes, upcoming appointments, etc.  Non-urgent messages can be sent to your provider as well.   To learn more about what you can do with MyChart, go to [email or web address].    Your next appointment:   6 month(s)  The format for your next appointment:   In Person  Provider:   Oswaldo Milian, MD   Recommend [facility]

## 2021-04-07 ENCOUNTER — Other Ambulatory Visit (INDEPENDENT_AMBULATORY_CARE_PROVIDER_SITE_OTHER): Payer: BC Managed Care – PPO

## 2021-04-07 DIAGNOSIS — Z1211 Encounter for screening for malignant neoplasm of colon: Secondary | ICD-10-CM | POA: Diagnosis not present

## 2021-04-07 LAB — POC FIT TEST STOOL: Fecal Occult Blood: POSITIVE

## 2021-06-01 ENCOUNTER — Other Ambulatory Visit: Payer: Self-pay | Admitting: Internal Medicine

## 2021-06-01 MED ORDER — GEMFIBROZIL 600 MG PO TABS: 600.0000 mg | ORAL_TABLET | Freq: Two times a day (BID) | ORAL | 11 refills | Status: DC

## 2021-06-01 MED ORDER — METFORMIN HCL ER 500 MG PO TB24: 500.0000 mg | ORAL_TABLET | Freq: Two times a day (BID) | ORAL | 3 refills | Status: DC

## 2021-06-11 ENCOUNTER — Telehealth: Payer: Self-pay

## 2021-06-11 MED ORDER — OMEGA-3-ACID ETHYL ESTERS 1 G PO CAPS: ORAL_CAPSULE | ORAL | 11 refills | Status: DC

## 2021-06-11 NOTE — Telephone Encounter (Signed)
Pt called to report that gemfibrozil is not working. Has had cramping, nerve pain that causes pain in hands and feet, feet feel swollen constantly. Side effects occurred same day that he started medication. he kept taking it expecting side effects to go away but it has not improved.

## 2021-06-11 NOTE — Telephone Encounter (Incomplete)
Have him stop the Gemfibrozil.  Would like to switch him to something called Vascepa, which has effects like fish oil.  Not sure if it will have much of an effect on LDL, but can try it and see.  Have him wait until he is feeling well after stopping gemfibrozil.  If this does not help, may need him talk to Cardiology about injectable cholesterol lowering agents.  Cancel above--does not appear Dalene Seltzer is covered by his insurance.  Therefore, will send in Rx for (lovaza)fish oil that does not have a bad taste and have him take 2 g twice daily.  FLP and hepatic profile in 2 months.

## 2021-06-12 NOTE — Telephone Encounter (Signed)
Pt notified or Rx changes and scheduled

## 2021-07-03 ENCOUNTER — Other Ambulatory Visit: Payer: BC Managed Care – PPO

## 2021-07-18 ENCOUNTER — Ambulatory Visit: Payer: BC Managed Care – PPO | Admitting: Internal Medicine

## 2021-07-29 IMAGING — CT CT CARDIAC CORONARY ARTERY CALCIUM SCORE
3 series · 14 of 20 positions shown, 15 images · non-contrast
Comparison: None.
COMPARISON: None.

Addendum:
EXAM:
OVER-READ INTERPRETATION  CT CHEST

The following report is an over-read performed by radiologist Dr.
over-read does not include interpretation of cardiac or coronary
anatomy or pathology. The coronary calcium score interpretation by
the cardiologist is attached.
CLINICAL DATA: Risk stratification
Coronary Calcium Score
TECHNIQUE: The patient was scanned on a Siemens Somatom 64 slice scanner. Axial
non-contrast 3 mm slices were carried out through the heart. The
data set was analyzed on a dedicated work station and scored using
the Agatson method.

[Series 2: casc 3.0 bv41 2 bestsyst 37 % · axial · 0.41mm/px · z∈[-206,-134]mm · 4 of 40 slices shown, 5 images]
[im 8/40  vessel]
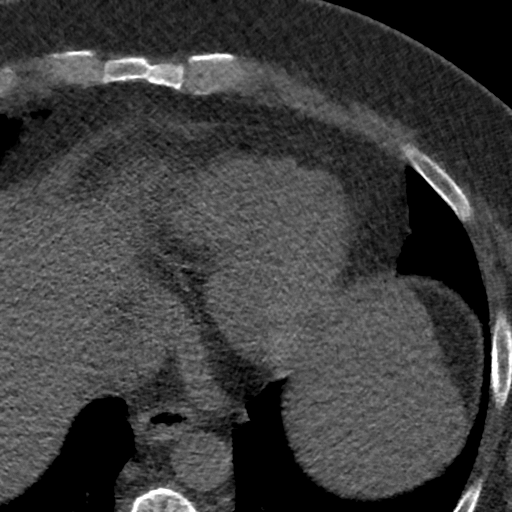
[im 8/40  lung]
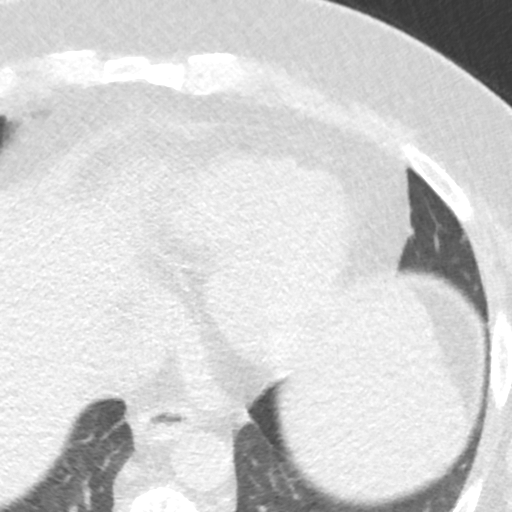
[im 16/40  vessel]
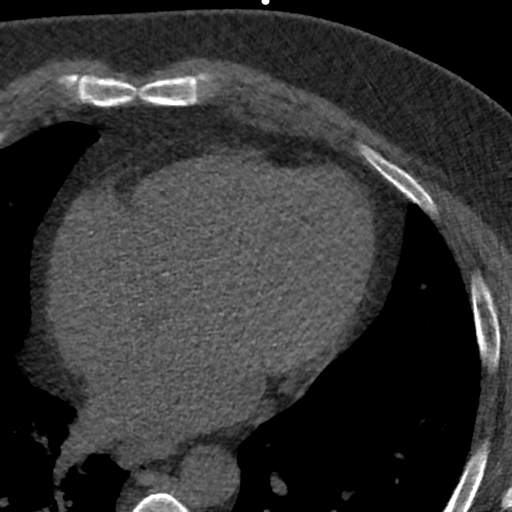
[im 24/40  vessel]
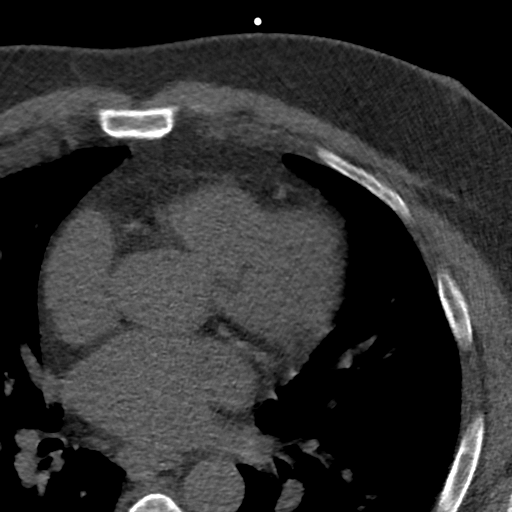
[im 32/40  vessel]
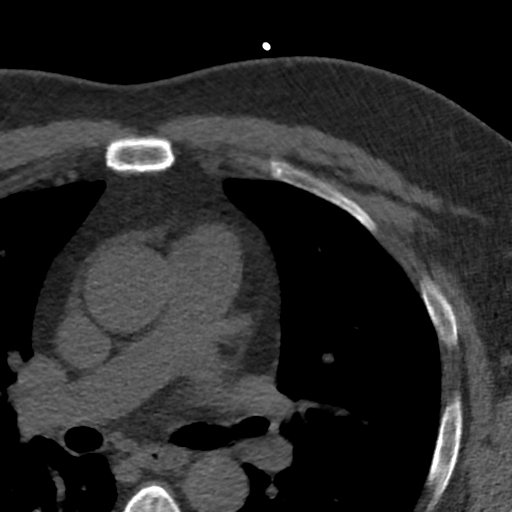

[Series 3: lung 39 % · axial · 0.76mm/px · z∈[-208,-130]mm · 5 of 40 slices shown]
[im 7/40  lung]
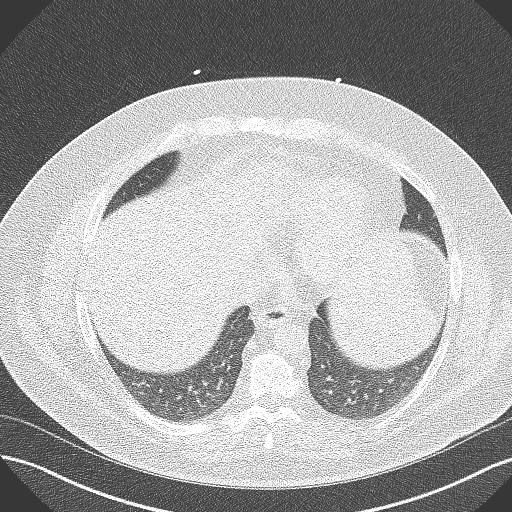
[im 14/40  lung]
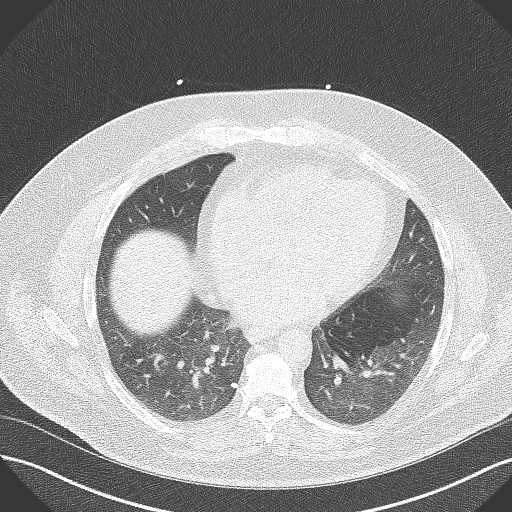
[im 20/40  lung]
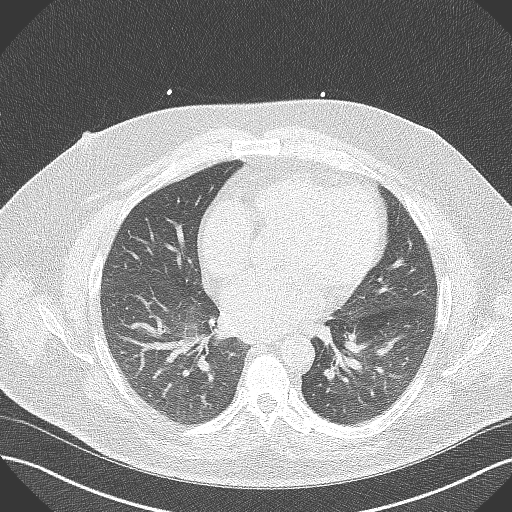
[im 27/40  lung]
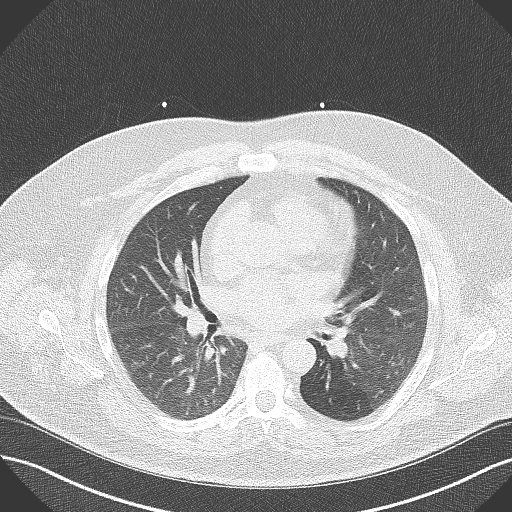
[im 33/40  lung]
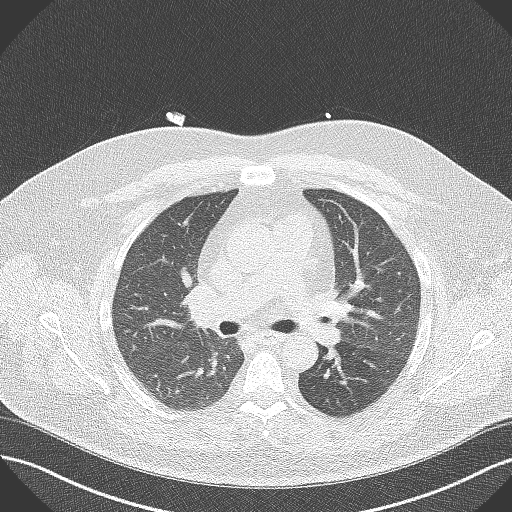

[Series 4: lung st 39 % · axial · 0.76mm/px · z∈[-208,-130]mm · 5 of 40 slices shown]
[im 7/40  lung]
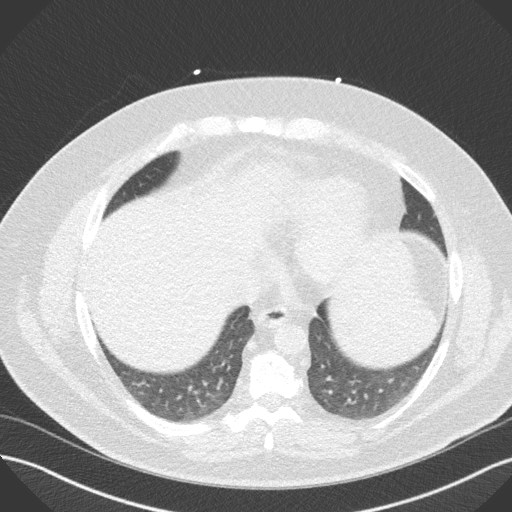
[im 14/40  lung]
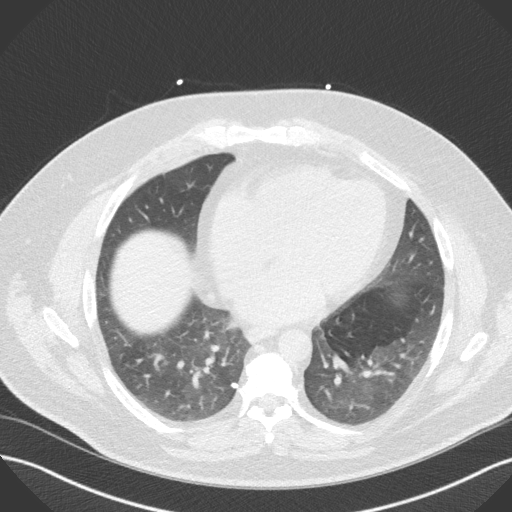
[im 20/40  lung]
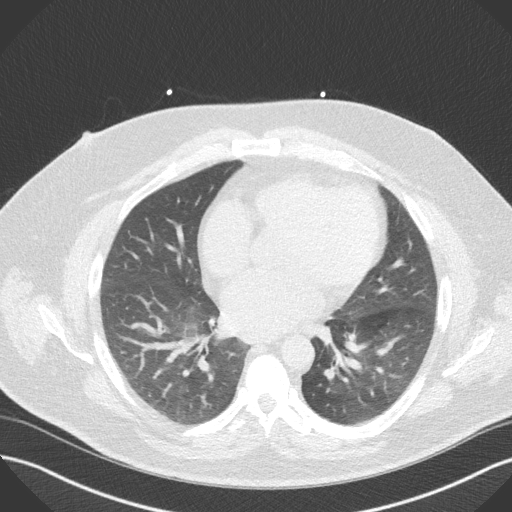
[im 27/40  lung]
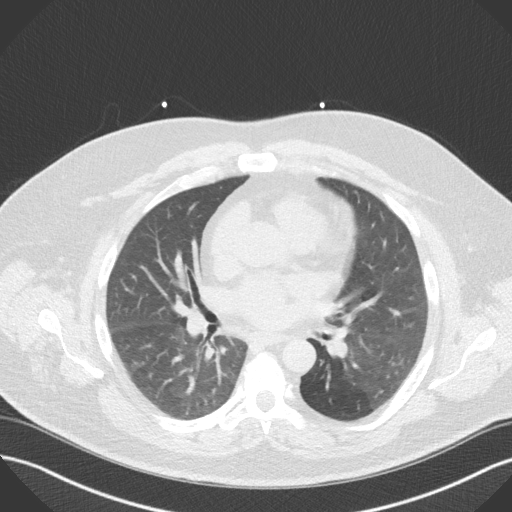
[im 33/40  lung]
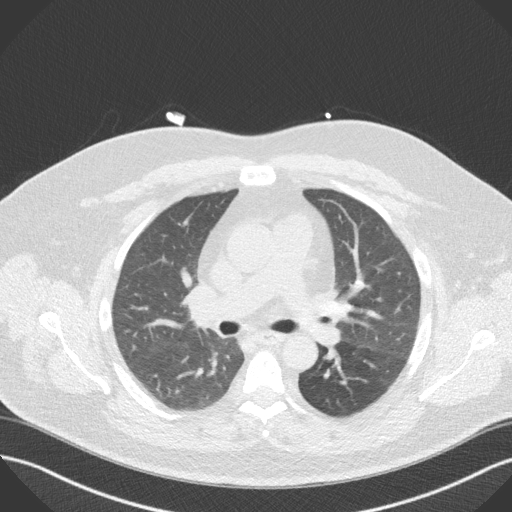

[14 of 20 positions shown; findings below may reference images not displayed]

FINDINGS: Small calcified granuloma in the medial aspect of the right lower
lobe. Within the visualized portions of the thorax there are no
suspicious appearing pulmonary nodules or masses, there is no acute
consolidative airspace disease, no pleural effusions, no
pneumothorax and no lymphadenopathy. Visualized portions of the
upper abdomen are unremarkable. There are no aggressive appearing
lytic or blastic lesions noted in the visualized portions of the
skeleton.
IMPRESSION: 1. No significant incidental noncardiac findings are noted.
FINDINGS: Non-cardiac: See separate report from [REDACTED].

Ascending aorta: Normal diameter 3.5 cm

Pericardium: Normal

Coronary arteries: No calcium noted
IMPRESSION: Coronary calcium score of 0.

Sil Teran

*** End of Addendum ***
EXAM:
OVER-READ INTERPRETATION  CT CHEST

The following report is an over-read performed by radiologist Dr.
over-read does not include interpretation of cardiac or coronary
anatomy or pathology. The coronary calcium score interpretation by
the cardiologist is attached.
FINDINGS: Small calcified granuloma in the medial aspect of the right lower
lobe. Within the visualized portions of the thorax there are no
suspicious appearing pulmonary nodules or masses, there is no acute
consolidative airspace disease, no pleural effusions, no
pneumothorax and no lymphadenopathy. Visualized portions of the
upper abdomen are unremarkable. There are no aggressive appearing
lytic or blastic lesions noted in the visualized portions of the
skeleton.
IMPRESSION: 1. No significant incidental noncardiac findings are noted.

## 2021-08-11 ENCOUNTER — Other Ambulatory Visit (INDEPENDENT_AMBULATORY_CARE_PROVIDER_SITE_OTHER): Payer: BC Managed Care – PPO

## 2021-08-11 DIAGNOSIS — E785 Hyperlipidemia, unspecified: Secondary | ICD-10-CM

## 2021-08-12 ENCOUNTER — Other Ambulatory Visit: Payer: Self-pay

## 2021-08-12 LAB — HEPATIC FUNCTION PANEL
ALT: 42 IU/L (ref 0–44)
AST: 27 IU/L (ref 0–40)
Albumin: 4.1 g/dL (ref 3.8–4.9)
Alkaline Phosphatase: 91 IU/L (ref 44–121)
Bilirubin Total: 0.5 mg/dL (ref 0.0–1.2)
Bilirubin, Direct: 0.2 mg/dL (ref 0.00–0.40)
Total Protein: 6.7 g/dL (ref 6.0–8.5)

## 2021-08-12 LAB — LIPID PANEL W/O CHOL/HDL RATIO
Cholesterol, Total: 133 mg/dL (ref 100–199)
HDL: 23 mg/dL — ABNORMAL LOW (ref >39)
LDL Chol Calc (NIH): 59 mg/dL (ref 0–99)
Triglycerides: 322 mg/dL — ABNORMAL HIGH (ref 0–149)
VLDL Cholesterol Cal: 51 mg/dL — ABNORMAL HIGH (ref 5–40)

## 2021-08-29 ENCOUNTER — Other Ambulatory Visit: Payer: Self-pay

## 2021-08-29 ENCOUNTER — Other Ambulatory Visit (INDEPENDENT_AMBULATORY_CARE_PROVIDER_SITE_OTHER): Payer: BC Managed Care – PPO

## 2021-08-29 DIAGNOSIS — E118 Type 2 diabetes mellitus with unspecified complications: Secondary | ICD-10-CM | POA: Diagnosis not present

## 2021-08-30 LAB — HEMOGLOBIN A1C
Est. average glucose Bld gHb Est-mCnc: 209 mg/dL
Hgb A1c MFr Bld: 8.9 % — ABNORMAL HIGH (ref 4.8–5.6)

## 2021-09-04 ENCOUNTER — Encounter: Payer: Self-pay | Admitting: Internal Medicine

## 2021-09-04 ENCOUNTER — Ambulatory Visit: Payer: BC Managed Care – PPO | Admitting: Internal Medicine

## 2021-09-04 ENCOUNTER — Other Ambulatory Visit: Payer: Self-pay

## 2021-09-04 VITALS — BP 146/80 | HR 92 | Resp 16 | Ht 70.0 in | Wt 246.0 lb

## 2021-09-04 DIAGNOSIS — G8929 Other chronic pain: Secondary | ICD-10-CM

## 2021-09-04 DIAGNOSIS — I480 Paroxysmal atrial fibrillation: Secondary | ICD-10-CM

## 2021-09-04 DIAGNOSIS — E118 Type 2 diabetes mellitus with unspecified complications: Secondary | ICD-10-CM | POA: Diagnosis not present

## 2021-09-04 DIAGNOSIS — E785 Hyperlipidemia, unspecified: Secondary | ICD-10-CM | POA: Diagnosis not present

## 2021-09-04 DIAGNOSIS — I10 Essential (primary) hypertension: Secondary | ICD-10-CM

## 2021-09-04 DIAGNOSIS — M25511 Pain in right shoulder: Secondary | ICD-10-CM

## 2021-09-04 NOTE — Progress Notes (Unsigned)
° ° °  Subjective:    Patient ID: Timothy [name], male   DOB: 03-18-1971, 51 y.o.   MRN: [id number]   HPI  Has lots of reasons why he is not controlled with most of chronic disease.   Paroxysmal Afib:  Decided to stop Eliquis as had significant bleed with dental procedure last year and after reading all of the possible side effects.  Stopped last summer through early fall.  Was also concerned about aggressive PT for his frozen shoulder, where was having dry needling and with electrical stimulation as well.  Has appt with cardiology end of March.    2.  Hypertension:  Feels his bp is high due to GI complaints last 2 days.  Systolic at home is running in 150 to 160.  Diastolic in 66Q.  Does not want to add BP meds  3.  DM:  gorged on candy the week before A1C.  Describes drinking high caffeine and sugar drinks daily, trying different beers as planning a visit to [place] later in year.    4.  Plans to give Metta Clines, LCSW a call to work on counseling.    5.  Leg cramps:  problem more and more.  6.  Frozen right shoulder:  much improved.  Current Meds  Medication Sig   diltiazem (CARDIZEM CD) 120 MG 24 hr capsule Take 1 capsule (120 mg total) by mouth daily.   gabapentin (NEURONTIN) 300 MG capsule 1 cap by mouth 3 times daily.   glipiZIDE (GLUCOTROL XL) 5 MG 24 hr tablet TAKE 1 TABLET BY MOUTH ONCE DAILY WITH BREAKFAST   ibuprofen (ADVIL,MOTRIN) 200 MG tablet Take 200 mg by mouth every 6 (six) hours as needed for headache or moderate pain.   lisinopril (ZESTRIL) 20 MG tablet 1 tab by mouth once daily in morning with Lisiniopril/HCTZ   lisinopril-hydrochlorothiazide (ZESTORETIC) 20-25 MG tablet 1 tab by mouth once daily in morning with Lisinopril 20 mg   metFORMIN (GLUCOPHAGE-XR) 500 MG 24 hr tablet Take 1 tablet (500 mg total) by mouth 2 (two) times daily with a meal.   omega-3 acid ethyl esters (LOVAZA) 1 g capsule 2 caps by mouth twice daily   sildenafil (REVATIO) 20 MG tablet TAKE ONE  TABLET BY MOUTH  AS NEEDED PRIOR TO ACTIVITY   Allergies  Allergen Reactions   Povidone-Iodine Other (See Comments)    BLISTERS   Statins     Insomnia, nightmares     Review of Systems    Objective:   BP (!) 146/80 (BP Location: Right Arm, Patient Position: Sitting, Cuff Size: Normal)    Pulse 92    Resp 16    Ht 5\' 10"  (1.778 m)    Wt 246 lb (111.6 kg)    BMI 35.30 kg/m   Physical Exam   Assessment & Plan   Follow up in 2 months to make sure he has started on changes. Super vitamin B complex, hydration for cramps  Talk to [facility] about sugar addiction and binging.

## 2021-09-04 NOTE — Patient Instructions (Signed)
First goal:  calendar to write goals by Friday ?Weekly goal with physical activity every Monday--start low and easy to do weekly increase that does not increase pain. ?

## 2021-09-18 ENCOUNTER — Other Ambulatory Visit: Payer: Self-pay

## 2021-09-18 ENCOUNTER — Ambulatory Visit: Payer: BC Managed Care – PPO | Admitting: Cardiology

## 2021-09-18 VITALS — BP 157/89 | HR 86 | Ht 70.0 in | Wt 253.2 lb

## 2021-09-18 DIAGNOSIS — I10 Essential (primary) hypertension: Secondary | ICD-10-CM | POA: Diagnosis not present

## 2021-09-18 DIAGNOSIS — I480 Paroxysmal atrial fibrillation: Secondary | ICD-10-CM

## 2021-09-18 DIAGNOSIS — E785 Hyperlipidemia, unspecified: Secondary | ICD-10-CM

## 2021-09-18 MED ORDER — DILTIAZEM HCL ER COATED BEADS 240 MG PO CP24: 240.0000 mg | ORAL_CAPSULE | Freq: Every day | ORAL | 3 refills | Status: DC
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
## 2021-09-18 NOTE — Patient Instructions (Signed)
Medication Instructions:  ?INCREASE diltiazem to 240 mg daily ? ?*If you need a refill on your cardiac medications before your next appointment, please call your pharmacy* ? ?Follow-Up: ?At [facility], you and your health needs are our priority.  As part of our continuing mission to provide you with exceptional heart care, we have created designated Provider Care Teams.  These Care Teams include your primary Cardiologist (physician) and Advanced Practice Providers (APPs -  Physician Assistants and Nurse Practitioners) who all work together to provide you with the care you need, when you need it. ? ?We recommend signing up for the patient portal called "MyChart".  Sign up information is provided on this After Visit Summary.  MyChart is used to connect with patients for Virtual Visits (Telemedicine).  Patients are able to view lab/test results, encounter notes, upcoming appointments, etc.  Non-urgent messages can be sent to your provider as well.   ?To learn more about what you can do with MyChart, go to [email or web address].   ? ?Your next appointment:   ?6 month(s) ? ?The format for your next appointment:   ?In Person ? ?Provider:   ?Dr. Gardiner Rhyme ? ?Other Instructions ?Please check your blood pressure and heart rate at home daily, write it down.  Call the office or send message via Mychart with the readings in 2 weeks for Dr. Gardiner Rhyme to review.  ? ?Recommend: [place] ? ?

## 2021-10-03 ENCOUNTER — Ambulatory Visit: Payer: Self-pay | Admitting: Nurse Practitioner

## 2021-10-06 ENCOUNTER — Encounter: Payer: Self-pay | Admitting: Cardiology

## 2021-10-10 NOTE — Telephone Encounter (Signed)
BP persistently elevated, can we schedule in pharmacy hypertension clinic?  Would bring BP log and home BP monitor to calibrate ?

## 2021-10-31 ENCOUNTER — Ambulatory Visit: Payer: BC Managed Care – PPO | Admitting: Pharmacist Clinician (PhC)/ Clinical Pharmacy Specialist

## 2021-10-31 ENCOUNTER — Encounter: Payer: Self-pay | Admitting: Pharmacist Clinician (PhC)/ Clinical Pharmacy Specialist

## 2021-10-31 DIAGNOSIS — I10 Essential (primary) hypertension: Secondary | ICD-10-CM | POA: Diagnosis not present

## 2021-10-31 NOTE — Assessment & Plan Note (Signed)
Patient with essential hypertension, controlled in the office today.  Home meter was consistently 12-15 points higher than office cuff as well as by palpation.  Will have him continue with home medications, and suggested he not use home cuff.  If BP at other medical appointments is elevated we can see him back in the office.   ?

## 2021-10-31 NOTE — Progress Notes (Signed)
? ? ? ?10/31/2021 ?[name] ?01/24/1971 ?[id number] ? ? ?HPI:  Timothy [name] is a 51 y.o. male patient of Dr Gardiner Rhyme, with a PMH below who presents today for hypertension clinic evaluation.  He was seen by Dr. Gardiner Rhyme last month, at which time his pressure was noted to be 157/89.  He was on lisinopril hctz 20/25, lisinopril 20 and diltiazem 120 mg.  Dilt was increased to 240 mg daily and he was asked to monitor.   About 2 weeks later patient reported home readings still elevated (range 129-174/76-103).  He was  asked to continue monitoring and follow up with CVRR. ? ?Patient in the office today for follow up.  He notes that BP issues started sometime aroudnd 2018, and has been controlled off and on since then.  Had some stressful times that caused it to be elevated, but stress has been decreasing.    ? ?Home 141/84 80 - pulse at 128/129  ? 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  ? ?Blood Pressure Goal:  130/80 ? ?Current Medications: diltiazem 240 mg qd, lisinopril hctz 20/25 mg qd, lisinopril 20 mg qd ? ?Family Hx: mom deceased with dementia, father DM2, some memory issues developing; 1 brother with multipe health issues - on disability; no children ? ?Social Hx: no tobacco, occasional rum and coke; drinks Red Bull on average 2 per day ? ?Diet: 2/3 of meals at home, mostly chicken and beef; some vegetables (fresh frozen and canned); eats more with pain from shoulder ? ?Exercise: PT for shoulder; trying to do more cardio,  ? ?Home BP readings: no readings with him, but by memory thinks mostly 140/80's range ? Home cuff read 141/84, although pulse was felt at 129 on his cuff on two separate trials.  Advised home cuff not accurate - high by 12-15 points.   ? ?Intolerances: statins - insomnia, dark mood/nightmares ? ?Labs: 9/22:  na 138, K 4.0, Glu   ? ? ?Wt Readings from Last 3 Encounters:  ?10/31/21 253 lb (114.8 kg)  ?09/18/21 253 lb 3.2 oz (114.9 kg)  ?09/04/21 246 lb (111.6 kg)  ? ?BP Readings from Last 3 Encounters:  ?10/31/21 128/72  ?09/18/21 (!) 157/89  ?09/04/21 (!) 146/80  ? ?Pulse Readings from Last 3 Encounters:  ?10/31/21 80  ?09/18/21 86  ?09/04/21 92  ? ? ?Current Outpatient Medications  ?Medication Sig Dispense Refill  ? Blood Glucose Monitoring Suppl (AGAMATRIX PRESTO) w/Device KIT Check blood glucose twice daily before meals 1 kit 0  ? diltiazem (CARDIZEM CD) 240 MG 24 hr capsule Take 1 capsule (240 mg total) by mouth daily. 90 capsule 3  ? gabapentin (NEURONTIN) 300 MG capsule 1 cap by mouth 3 times daily. 270 capsule 3  ? glipiZIDE (GLUCOTROL XL) 5 MG 24 hr tablet TAKE 1 TABLET BY MOUTH ONCE DAILY WITH BREAKFAST 90 tablet 3  ? glucose blood (AGAMATRIX PRESTO TEST) test strip Check blood glucose twice daily before meals 100 each 11  ? lisinopril (ZESTRIL) 20 MG tablet 1 tab by mouth once daily in morning with Lisiniopril/HCTZ 90 tablet 3  ? lisinopril-hydrochlorothiazide (ZESTORETIC) 20-25 MG tablet 1 tab by mouth once daily in morning with Lisinopril 20 mg 90 tablet 3  ? metFORMIN (GLUCOPHAGE-XR) 500 MG 24 hr tablet Take 1 tablet (500 mg total) by mouth 2 (two) times daily with  a meal. 180 tablet 3  ? PUMPKIN SEED PO Take 1 tablet by mouth daily.    ? sildenafil (REVATIO) 20 MG tablet TAKE ONE TABLET BY MOUTH  AS NEEDED PRIOR TO ACTIVITY 30 tablet 10  ? ?No current facility-administered medications for this visit.  ? ? ?Allergies  ?Allergen Reactions  ? Povidone-Iodine Other (See Comments)  ?  BLISTERS  ? Statins   ?  Insomnia, nightmares  ? ? ?Past Medical History:  ?Diagnosis Date  ? Allergy   ? seasonal  ? Cataract   ? Rt repaired, left ripening  ? Depression   ? Diabetes mellitus without complication ([place])   ? Hyperlipidemia   ? Hypertension   ? Irregular heart beat   ? Seizures ([place])   ? as infant  ? ? ?Blood pressure 128/72, pulse 80,  height 5' 10"  (1.778 m), weight 253 lb (114.8 kg). ? ?Essential hypertension ?Patient with essential hypertension, controlled in the office today.  Home meter was consistently 12-15 points higher than office cuff as well as by palpation.  Will have him continue with home medications, and suggested he not use home cuff.  If BP at other medical appointments is elevated we can see him back in the office.   ? ? ?Tommy Medal PharmD CPP [facility] ?[address] ?[place], [place] [address] ?[phone] ?

## 2021-10-31 NOTE — Patient Instructions (Signed)
No changes to your medications at this time. ? ?Your home cuff is about 14 points higher than the office devices.   ? ?Bring all of your meds, your BP cuff and your record of home blood pressures to your next appointment.  Exercise as you?re able, try to walk approximately 30 minutes per day.  Keep salt intake to a minimum, especially watch canned and prepared boxed foods.  Eat more fresh fruits and vegetables and fewer canned items.  Avoid eating in fast food restaurants.  ? ? HOW TO TAKE YOUR BLOOD PRESSURE: ?Rest 5 minutes before taking your blood pressure. ? Don?t smoke or drink caffeinated beverages for at least 30 minutes before. ?Take your blood pressure before (not after) you eat. ?Sit comfortably with your back supported and both feet on the floor (don?t cross your legs). ?Elevate your arm to heart level on a table or a desk. ?Use the proper sized cuff. It should fit smoothly and snugly around your bare upper arm. There should be enough room to slip a fingertip under the cuff. The bottom edge of the cuff should be 1 inch above the crease of the elbow. ?Ideally, take 3 measurements at one sitting and record the average. ? ? ?

## 2021-11-21 ENCOUNTER — Encounter: Payer: Self-pay | Admitting: Internal Medicine

## 2021-11-21 ENCOUNTER — Ambulatory Visit (INDEPENDENT_AMBULATORY_CARE_PROVIDER_SITE_OTHER): Payer: BC Managed Care – PPO | Admitting: Internal Medicine

## 2021-11-21 VITALS — BP 136/60 | HR 100 | Resp 16 | Ht 70.0 in | Wt 244.0 lb

## 2021-11-21 DIAGNOSIS — I10 Essential (primary) hypertension: Secondary | ICD-10-CM

## 2021-11-21 DIAGNOSIS — E118 Type 2 diabetes mellitus with unspecified complications: Secondary | ICD-10-CM | POA: Diagnosis not present

## 2021-11-21 DIAGNOSIS — Z23 Encounter for immunization: Secondary | ICD-10-CM

## 2021-11-21 NOTE — Progress Notes (Signed)
    Subjective:    Patient ID: Timothy [name], male   DOB: 1970-09-02, 51 y.o.   MRN: [id number]   HPI   Hypertension:  Wonders about decreasing bp meds again.  Having muscle cramping of legs. Continues to drink Red Bulls throughout the day--have a hard time getting him to quantitate--later states drinking 2-4 Red Bulls daily.    2.  DM:  Has been counseling with Timothy Clines, LCSW:  Really has just done intake visits thus far.  Feeling is one of his main problems is imposter syndrome.  States his diet other than liquid sugar, is improving.    Long discussion about gradually increasing goals with decreasing Red Bulls and increasing physical activity.  He has a plan to start canoeing.  Unable to pin him down about definitive goals.    Current Meds  Medication Sig   diltiazem (CARDIZEM CD) 240 MG 24 hr capsule Take 1 capsule (240 mg total) by mouth daily.   gabapentin (NEURONTIN) 300 MG capsule 1 cap by mouth 3 times daily.   glipiZIDE (GLUCOTROL XL) 5 MG 24 hr tablet TAKE 1 TABLET BY MOUTH ONCE DAILY WITH BREAKFAST   lisinopril (ZESTRIL) 20 MG tablet 1 tab by mouth once daily in morning with Lisiniopril/HCTZ   lisinopril-hydrochlorothiazide (ZESTORETIC) 20-25 MG tablet 1 tab by mouth once daily in morning with Lisinopril 20 mg   metFORMIN (GLUCOPHAGE-XR) 500 MG 24 hr tablet Take 1 tablet (500 mg total) by mouth 2 (two) times daily with a meal.   PUMPKIN SEED PO Take 1 tablet by mouth daily.   sildenafil (REVATIO) 20 MG tablet TAKE ONE TABLET BY MOUTH  AS NEEDED PRIOR TO ACTIVITY   Allergies  Allergen Reactions   Povidone-Iodine Other (See Comments)    BLISTERS   Statins     Insomnia, nightmares     Review of Systems    Objective:   BP 136/60 (BP Location: Right Arm, Patient Position: Sitting, Cuff Size: Normal)   Pulse 100   Resp 16   Ht '5\' 10"'$  (1.778 m)   Wt 244 lb (110.7 kg)   BMI 35.01 kg/m   Physical Exam NAD Lungs:  CTA CV:  RRR wiithout murmur or rub.  Radial  pulses normal and equal.   LE:  no edema.   Assessment & Plan   Muscle cramping:  urged him to wean off his Red Bull gradually as cause of cramping.  Certainly the sugar and caffeine are not good for him with DM and history of paroxysmal Afib.  Encouraged him to work on sleep wake cycle, which would also include going outside for physical activity.  2.  DM:  encouraged him to really start making and writing down simple goals for eating habits and physical activity on a weekly basis.  He gives me verbal permission to speak to Timothy Burows, LCSW about difficulty getting him on regimens to get chronic health issues under control   3.  Hypertension:  controlled.  Discussed would like him to stay on all of his meds. Work on [employer] instead.  4.  HM:  Moderna Bivalent vaccine.

## 2022-01-12 ENCOUNTER — Other Ambulatory Visit: Payer: Self-pay | Admitting: Internal Medicine

## 2022-01-15 ENCOUNTER — Other Ambulatory Visit: Payer: Self-pay

## 2022-03-11 ENCOUNTER — Other Ambulatory Visit: Payer: Self-pay | Admitting: Internal Medicine

## 2022-03-17 ENCOUNTER — Other Ambulatory Visit: Payer: Self-pay
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
## 2022-03-24 ENCOUNTER — Other Ambulatory Visit (INDEPENDENT_AMBULATORY_CARE_PROVIDER_SITE_OTHER): Payer: BC Managed Care – PPO | Admitting: Internal Medicine

## 2022-03-24 DIAGNOSIS — E785 Hyperlipidemia, unspecified: Secondary | ICD-10-CM

## 2022-03-24 DIAGNOSIS — E118 Type 2 diabetes mellitus with unspecified complications: Secondary | ICD-10-CM | POA: Diagnosis not present

## 2022-03-24 DIAGNOSIS — Z79899 Other long term (current) drug therapy: Secondary | ICD-10-CM | POA: Diagnosis not present

## 2022-03-25 LAB — COMPREHENSIVE METABOLIC PANEL
ALT: 45 IU/L — ABNORMAL HIGH (ref 0–44)
AST: 23 IU/L (ref 0–40)
Albumin/Globulin Ratio: 1.8 (ref 1.2–2.2)
Albumin: 4.3 g/dL (ref 3.8–4.9)
Alkaline Phosphatase: 98 IU/L (ref 44–121)
BUN/Creatinine Ratio: 13 (ref 9–20)
BUN: 14 mg/dL (ref 6–24)
Bilirubin Total: 0.4 mg/dL (ref 0.0–1.2)
CO2: 25 mmol/L (ref 20–29)
Calcium: 9.7 mg/dL (ref 8.7–10.2)
Chloride: 97 mmol/L (ref 96–106)
Creatinine, Ser: 1.05 mg/dL (ref 0.76–1.27)
Globulin, Total: 2.4 g/dL (ref 1.5–4.5)
Glucose: 199 mg/dL — ABNORMAL HIGH (ref 70–99)
Potassium: 3.6 mmol/L (ref 3.5–5.2)
Sodium: 138 mmol/L (ref 134–144)
Total Protein: 6.7 g/dL (ref 6.0–8.5)
eGFR: 86 mL/min/{1.73_m2} (ref >59)

## 2022-03-25 LAB — CBC WITH DIFFERENTIAL/PLATELET
Basophils Absolute: 0.1 10*3/uL (ref 0.0–0.2)
Basos: 1 %
EOS (ABSOLUTE): 0.3 10*3/uL (ref 0.0–0.4)
Eos: 3 %
Hematocrit: 43.2 % (ref 37.5–51.0)
Hemoglobin: 14.8 g/dL (ref 13.0–17.7)
Immature Grans (Abs): 0 10*3/uL (ref 0.0–0.1)
Immature Granulocytes: 0 %
Lymphocytes Absolute: 2.1 10*3/uL (ref 0.7–3.1)
Lymphs: 22 %
MCH: 28.8 pg (ref 26.6–33.0)
MCHC: 34.3 g/dL (ref 31.5–35.7)
MCV: 84 fL (ref 79–97)
Monocytes Absolute: 0.5 10*3/uL (ref 0.1–0.9)
Monocytes: 6 %
Neutrophils Absolute: 6.4 10*3/uL (ref 1.4–7.0)
Neutrophils: 68 %
Platelets: 235 10*3/uL (ref 150–450)
RBC: 5.13 x10E6/uL (ref 4.14–5.80)
RDW: 14.6 % (ref 11.6–15.4)
WBC: 9.4 10*3/uL (ref 3.4–10.8)

## 2022-03-25 LAB — LIPID PANEL W/O CHOL/HDL RATIO
Cholesterol, Total: 119 mg/dL (ref 100–199)
HDL: 25 mg/dL — ABNORMAL LOW (ref >39)
LDL Chol Calc (NIH): 52 mg/dL (ref 0–99)
Triglycerides: 263 mg/dL — ABNORMAL HIGH (ref 0–149)
VLDL Cholesterol Cal: 42 mg/dL — ABNORMAL HIGH (ref 5–40)

## 2022-03-25 LAB — MICROALBUMIN / CREATININE URINE RATIO
Creatinine, Urine: 207.5 mg/dL
Microalb/Creat Ratio: 29 mg/g creat (ref 0–29)
Microalbumin, Urine: 60 ug/mL

## 2022-03-25 LAB — HEMOGLOBIN A1C
Est. average glucose Bld gHb Est-mCnc: 206 mg/dL
Hgb A1c MFr Bld: 8.8 % — ABNORMAL HIGH (ref 4.8–5.6)

## 2022-03-26 ENCOUNTER — Encounter: Payer: Self-pay | Admitting: Cardiology

## 2022-03-26 ENCOUNTER — Ambulatory Visit: Payer: BC Managed Care – PPO | Attending: Cardiology | Admitting: Cardiology

## 2022-03-26 VITALS — BP 134/85 | HR 86 | Ht 70.0 in | Wt 242.6 lb

## 2022-03-26 DIAGNOSIS — I10 Essential (primary) hypertension: Secondary | ICD-10-CM

## 2022-03-26 DIAGNOSIS — E785 Hyperlipidemia, unspecified: Secondary | ICD-10-CM

## 2022-03-26 DIAGNOSIS — I480 Paroxysmal atrial fibrillation: Secondary | ICD-10-CM

## 2022-03-26 DIAGNOSIS — R0683 Snoring: Secondary | ICD-10-CM

## 2022-03-26 NOTE — Patient Instructions (Addendum)
Medication Instructions:  Your physician recommends that you continue on your current medications as directed. Please refer to the Current Medication list given to you today.  *If you need a refill on your cardiac medications before your next appointment, please call your pharmacy*  Follow-Up: At [facility], you and your health needs are our priority.  As part of our continuing mission to provide you with exceptional heart care, we have created designated Provider Care Teams.  These Care Teams include your primary Cardiologist (physician) and Advanced Practice Providers (APPs -  Physician Assistants and Nurse Practitioners) who all work together to provide you with the care you need, when you need it.  We recommend signing up for the patient portal called "MyChart".  Sign up information is provided on this After Visit Summary.  MyChart is used to connect with patients for Virtual Visits (Telemedicine).  Patients are able to view lab/test results, encounter notes, upcoming appointments, etc.  Non-urgent messages can be sent to your provider as well.   To learn more about what you can do with MyChart, go to [email or web address].    Your next appointment:   6 month(s)  The format for your next appointment:   In Person  Provider:   Dr. Gardiner Rhyme Other Instructions  KARDIA MOBILE

## 2022-03-27 ENCOUNTER — Encounter: Payer: Self-pay | Admitting: Internal Medicine

## 2022-03-27 ENCOUNTER — Ambulatory Visit (INDEPENDENT_AMBULATORY_CARE_PROVIDER_SITE_OTHER): Payer: BC Managed Care – PPO | Admitting: Internal Medicine

## 2022-03-27 VITALS — BP 150/84 | HR 88 | Resp 12 | Ht 70.0 in | Wt 239.0 lb

## 2022-03-27 DIAGNOSIS — E785 Hyperlipidemia, unspecified: Secondary | ICD-10-CM

## 2022-03-27 DIAGNOSIS — I10 Essential (primary) hypertension: Secondary | ICD-10-CM | POA: Diagnosis not present

## 2022-03-27 DIAGNOSIS — F32A Depression, unspecified: Secondary | ICD-10-CM

## 2022-03-27 DIAGNOSIS — Z0000 Encounter for general adult medical examination without abnormal findings: Secondary | ICD-10-CM

## 2022-03-27 DIAGNOSIS — E118 Type 2 diabetes mellitus with unspecified complications: Secondary | ICD-10-CM

## 2022-03-27 MED ORDER — FLUOXETINE HCL 10 MG PO CAPS: 10.0000 mg | ORAL_CAPSULE | Freq: Every day | ORAL | 3 refills | Status: DC

## 2022-03-27 MED ORDER — EMPAGLIFLOZIN 10 MG PO TABS: 10.0000 mg | ORAL_TABLET | Freq: Every day | ORAL | 11 refills | Status: DC
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
## 2022-04-11 ENCOUNTER — Other Ambulatory Visit: Payer: Self-pay | Admitting: Internal Medicine

## 2022-04-16 ENCOUNTER — Encounter: Payer: Self-pay | Admitting: Internal Medicine

## 2022-04-16 ENCOUNTER — Ambulatory Visit: Payer: BC Managed Care – PPO | Admitting: Internal Medicine

## 2022-04-16 VITALS — BP 124/70 | HR 84 | Resp 12 | Ht 70.0 in | Wt 238.0 lb

## 2022-04-16 DIAGNOSIS — Z1211 Encounter for screening for malignant neoplasm of colon: Secondary | ICD-10-CM

## 2022-04-16 DIAGNOSIS — F32A Depression, unspecified: Secondary | ICD-10-CM | POA: Diagnosis not present

## 2022-04-16 DIAGNOSIS — E118 Type 2 diabetes mellitus with unspecified complications: Secondary | ICD-10-CM | POA: Diagnosis not present

## 2022-04-16 DIAGNOSIS — Z23 Encounter for immunization: Secondary | ICD-10-CM | POA: Diagnosis not present

## 2022-04-16 LAB — POCT GLUCOSE (DEVICE FOR HOME USE): POC Glucose: 261 mg/dl — AB (ref 70–99)

## 2022-04-16 LAB — POC FIT TEST STOOL: Fecal Occult Blood: NEGATIVE

## 2022-04-16 NOTE — Progress Notes (Addendum)
    Subjective:    Patient ID: Timothy [name], male   DOB: 1971-01-21, 51 y.o.   MRN: [id number]   HPI    Depression:  Since day 5 of starting Prozac, developed increased numbness and tingling in feet and now hands for first time.  However, he feels his balance is better.   No thoughts of suicide.  Feels "even keeled" since starting Prozac.  States no longer crying for what he felt were minimal reasons as before starting Prozac.  2.  DM:  Started Jardiance day after Prozac.  Tolerating fine.  Continues with poor diet, including Red Bulls at least twice daily  Current Meds  Medication Sig   b complex vitamins capsule Take 1 capsule by mouth daily.   diltiazem (CARDIZEM CD) 240 MG 24 hr capsule Take 1 capsule (240 mg total) by mouth daily.   empagliflozin (JARDIANCE) 10 MG TABS tablet Take 1 tablet (10 mg total) by mouth daily before breakfast.   FLUoxetine (PROZAC) 10 MG capsule Take 1 capsule (10 mg total) by mouth daily.   gabapentin (NEURONTIN) 300 MG capsule TAKE 1 CAPSULE BY MOUTH THREE TIMES DAILY   glipiZIDE (GLUCOTROL XL) 5 MG 24 hr tablet Take 1 tablet by mouth once daily with breakfast   lisinopril (ZESTRIL) 20 MG tablet TAKE 1 TABLET BY MOUTH ONCE DAILY IN THE MORNING WITH  LISINOPRIL/HCTZ   lisinopril-hydrochlorothiazide (ZESTORETIC) 20-25 MG tablet TAKE 1 TABLET BY MOUTH ONCE DAILY IN THE MORNING WITH  LISINOPRIL  20  MG   metFORMIN (GLUCOPHAGE-XR) 500 MG 24 hr tablet Take 1 tablet (500 mg total) by mouth 2 (two) times daily with a meal.   PUMPKIN SEED PO Take 1 tablet by mouth daily.   sildenafil (REVATIO) 20 MG tablet TAKE 1 TABLET BY MOUTH ONCE DAILY AS NEEDED PRIOR  TO  ACTIVITY   Allergies  Allergen Reactions   Povidone-Iodine Other (See Comments)    BLISTERS   Statins     Insomnia, nightmares     Review of Systems    Objective:   BP 124/70 (BP Location: Right Arm, Patient Position: Sitting, Cuff Size: Normal)   Pulse 84   Resp 12   Ht '5\' 10"'$  (1.778 m)    Wt 238 lb (108 kg)   BMI 34.15 kg/m   Physical Exam NAD Good eye contact.   Mood appears good Lungs:  CTA CV:  RRR      Assessment & Plan    Depression:  continue Fluoxetine/Prozac at 10 mg daily for now.  Continues to work with Metta Clines, LCSW.  2.  DM:  addition of Jardiance.    3.  HM:  FIT negative this time and associated with normal CBC again.  Influenza vaccine today.  Encouraged obtaining COVID vaccine.

## 2022-04-21 DIAGNOSIS — F32A Depression, unspecified: Secondary | ICD-10-CM | POA: Insufficient documentation

## 2022-05-21 ENCOUNTER — Ambulatory Visit: Payer: BC Managed Care – PPO | Admitting: Internal Medicine

## 2022-05-21 ENCOUNTER — Encounter: Payer: Self-pay | Admitting: Internal Medicine

## 2022-05-21 VITALS — BP 138/78 | HR 84 | Resp 16 | Ht 70.0 in | Wt 241.0 lb

## 2022-05-21 DIAGNOSIS — F32A Depression, unspecified: Secondary | ICD-10-CM

## 2022-05-21 DIAGNOSIS — E118 Type 2 diabetes mellitus with unspecified complications: Secondary | ICD-10-CM | POA: Diagnosis not present

## 2022-05-21 NOTE — Progress Notes (Signed)
    Subjective:    Patient ID: Timothy [name], male   DOB: 01/30/1971, 51 y.o.   MRN: [id number]   HPI   Depression:  Feels he is doing well on the Fluoxetine 10 mg daily.  No longer crying.  Not getting work done. Would like to wait until end of year to see if improves and if better getting off caffeine.  States work is broken as [place] is involved with the app developed and they have not been getting work done.  Also finding lots of problems in build   2.  DM:  Doing well with the Jardiance.  Has noted his stools are formed for first time in a while.  He is eating 4 dinners per night and drinking 4 redbulls--blaming this on the Fluoxetine.  States he is planning not to buy any more.    Current Meds  Medication Sig   b complex vitamins capsule Take 1 capsule by mouth daily.   diltiazem (CARDIZEM CD) 240 MG 24 hr capsule Take 1 capsule (240 mg total) by mouth daily.   empagliflozin (JARDIANCE) 10 MG TABS tablet Take 1 tablet (10 mg total) by mouth daily before breakfast.   FLUoxetine (PROZAC) 10 MG capsule Take 1 capsule (10 mg total) by mouth daily.   gabapentin (NEURONTIN) 300 MG capsule TAKE 1 CAPSULE BY MOUTH THREE TIMES DAILY   glipiZIDE (GLUCOTROL XL) 5 MG 24 hr tablet Take 1 tablet by mouth once daily with breakfast   lisinopril (ZESTRIL) 20 MG tablet TAKE 1 TABLET BY MOUTH ONCE DAILY IN THE MORNING WITH  LISINOPRIL/HCTZ   lisinopril-hydrochlorothiazide (ZESTORETIC) 20-25 MG tablet TAKE 1 TABLET BY MOUTH ONCE DAILY IN THE MORNING WITH  LISINOPRIL  20  MG   metFORMIN (GLUCOPHAGE-XR) 500 MG 24 hr tablet Take 1 tablet (500 mg total) by mouth 2 (two) times daily with a meal.   PUMPKIN SEED PO Take 1 tablet by mouth daily.   sildenafil (REVATIO) 20 MG tablet TAKE 1 TABLET BY MOUTH ONCE DAILY AS NEEDED PRIOR  TO  ACTIVITY   Allergies  Allergen Reactions   Povidone-Iodine Other (See Comments)    BLISTERS   Statins     Insomnia, nightmares     Review of Systems    Objective:    BP 138/78 (BP Location: Right Arm, Patient Position: Sitting, Cuff Size: Normal)   Pulse 84   Resp 16   Ht '5\' 10"'$  (1.778 m)   Wt 241 lb (109.3 kg)   BMI 34.58 kg/m   Physical Exam NAD Lungs:  CTA CV:  RRR without murmur or rub.  Radial pulses normal and equal.   Assessment & Plan    Depression:  stable on Fluoxetine 10 mg daily.    2  DM:  Continues with poor diet.  Tolerating addition of Red Bull.  Have discussed making dietary changes for some time.  Hopefully, will make a change with discontinuation of high glucose/caffeine intake.

## 2022-06-11 ENCOUNTER — Other Ambulatory Visit: Payer: Self-pay | Admitting: Internal Medicine

## 2022-07-11 ENCOUNTER — Other Ambulatory Visit: Payer: Self-pay | Admitting: Cardiology

## 2022-07-23 ENCOUNTER — Other Ambulatory Visit: Payer: Self-pay | Admitting: Internal Medicine

## 2022-07-24 ENCOUNTER — Other Ambulatory Visit: Payer: BC Managed Care – PPO

## 2022-07-27 ENCOUNTER — Ambulatory Visit: Payer: BC Managed Care – PPO | Admitting: Internal Medicine

## 2022-08-17 ENCOUNTER — Other Ambulatory Visit (INDEPENDENT_AMBULATORY_CARE_PROVIDER_SITE_OTHER): Payer: BC Managed Care – PPO

## 2022-08-17 DIAGNOSIS — E118 Type 2 diabetes mellitus with unspecified complications: Secondary | ICD-10-CM

## 2022-08-18 LAB — HEMOGLOBIN A1C
Est. average glucose Bld gHb Est-mCnc: 177 mg/dL
Hgb A1c MFr Bld: 7.8 % — ABNORMAL HIGH (ref 4.8–5.6)

## 2022-08-20 ENCOUNTER — Ambulatory Visit: Payer: BC Managed Care – PPO | Admitting: Internal Medicine

## 2022-08-20 VITALS — BP 130/70 | HR 76 | Ht 70.0 in | Wt 239.0 lb

## 2022-08-20 DIAGNOSIS — E118 Type 2 diabetes mellitus with unspecified complications: Secondary | ICD-10-CM

## 2022-08-20 DIAGNOSIS — F32A Depression, unspecified: Secondary | ICD-10-CM | POA: Diagnosis not present

## 2022-08-20 MED ORDER — TRIAMCINOLONE ACETONIDE 0.1 % EX CREA: 1.0000 | TOPICAL_CREAM | Freq: Two times a day (BID) | CUTANEOUS | 1 refills | Status: DC

## 2022-08-20 NOTE — Patient Instructions (Signed)
Tepid shower Dove Soap for bathing Eucerin Cream for Eczema relief
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
## 2022-08-24 ENCOUNTER — Encounter: Payer: Self-pay | Admitting: Internal Medicine

## 2022-10-09 ENCOUNTER — Other Ambulatory Visit: Payer: Self-pay | Admitting: Cardiology

## 2022-11-11 ENCOUNTER — Other Ambulatory Visit: Payer: Self-pay

## 2022-11-11 MED ORDER — DILTIAZEM HCL ER COATED BEADS 240 MG PO CP24: ORAL_CAPSULE | ORAL | 0 refills | Status: DC

## 2022-11-17 ENCOUNTER — Other Ambulatory Visit: Payer: BC Managed Care – PPO

## 2022-11-18 ENCOUNTER — Other Ambulatory Visit: Payer: BC Managed Care – PPO

## 2022-11-19 ENCOUNTER — Ambulatory Visit: Payer: BC Managed Care – PPO | Admitting: Internal Medicine

## 2022-12-05 ENCOUNTER — Other Ambulatory Visit: Payer: Self-pay | Admitting: Internal Medicine

## 2022-12-13 ENCOUNTER — Other Ambulatory Visit: Payer: Self-pay | Admitting: Cardiology

## 2023-01-09 ENCOUNTER — Other Ambulatory Visit: Payer: Self-pay | Admitting: Cardiology

## 2023-01-12 MED ORDER — DILTIAZEM HCL ER COATED BEADS 240 MG PO CP24: ORAL_CAPSULE | ORAL | 0 refills | Status: DC

## 2023-01-29 ENCOUNTER — Other Ambulatory Visit (INDEPENDENT_AMBULATORY_CARE_PROVIDER_SITE_OTHER): Payer: BC Managed Care – PPO

## 2023-01-29 DIAGNOSIS — E118 Type 2 diabetes mellitus with unspecified complications: Secondary | ICD-10-CM | POA: Diagnosis not present

## 2023-02-02 ENCOUNTER — Ambulatory Visit: Payer: BC Managed Care – PPO | Admitting: Internal Medicine

## 2023-02-02 ENCOUNTER — Encounter: Payer: Self-pay | Admitting: Internal Medicine

## 2023-02-02 VITALS — BP 138/82 | HR 85 | Resp 16 | Ht 70.0 in | Wt 238.0 lb

## 2023-02-02 DIAGNOSIS — M79604 Pain in right leg: Secondary | ICD-10-CM

## 2023-02-02 DIAGNOSIS — I10 Essential (primary) hypertension: Secondary | ICD-10-CM | POA: Diagnosis not present

## 2023-02-02 DIAGNOSIS — I480 Paroxysmal atrial fibrillation: Secondary | ICD-10-CM

## 2023-02-02 DIAGNOSIS — F32A Depression, unspecified: Secondary | ICD-10-CM

## 2023-02-02 DIAGNOSIS — M79605 Pain in left leg: Secondary | ICD-10-CM

## 2023-02-02 DIAGNOSIS — E118 Type 2 diabetes mellitus with unspecified complications: Secondary | ICD-10-CM

## 2023-02-02 DIAGNOSIS — G8929 Other chronic pain: Secondary | ICD-10-CM

## 2023-02-02 DIAGNOSIS — F840 Autistic disorder: Secondary | ICD-10-CM

## 2023-02-02 DIAGNOSIS — M2170 Unequal limb length (acquired), unspecified site: Secondary | ICD-10-CM | POA: Insufficient documentation

## 2023-02-02 MED ORDER — GABAPENTIN 300 MG PO CAPS: ORAL_CAPSULE | ORAL | 3 refills | Status: DC

## 2023-02-02 MED ORDER — EMPAGLIFLOZIN 25 MG PO TABS
25.0000 mg | ORAL_TABLET | Freq: Every day | ORAL | 3 refills | Status: DC
Start: 2023-02-02 — End: 2023-02-15

## 2023-02-02 MED ORDER — OXYCODONE HCL 5 MG PO TABS: ORAL_TABLET | ORAL | 0 refills | Status: DC

## 2023-02-02 NOTE — Progress Notes (Signed)
Subjective:    Patient ID: Timothy [name], male   DOB: Dec 04, 1970, 52 y.o.   MRN: [id number]   HPI   Was caring for a friend who became severely ill with undiagnosed lymphoma.  Ultimately, ended up using his friend's pain meds, Oxy IR 5 mg for his leg pain as he was on his feet for hours a day, cleaning his home after he died for the month of 2022-11-28.  He has not used regularly and so no concern for withdrawal.  He feels he has been much more physically active as he is not in as much pain regarding his legs He has become much more social as well as he is more active and has made friendships with the folks he has gotten to know through his friend who died.     He has a significant right leg length discrepancy and has been seen by ortho and podiatry. Regarding his depression, he no longer is counseling with Nilda Simmer, LCSW.   He does not feel he needs grief counseling.    2. DM: A1C is down to 7.5%.  See activity above.  Drinking Red Bull again, though weaning again..  3.  Hx paroxysmal afib:  he is not having palpitations.  4.  Intermittent Hives:  controlled with Triamcinolone cream as needed once to twice daily.    Current Meds  Medication Sig   b complex vitamins capsule Take 1 capsule by mouth daily.   diltiazem (CARTIA XT) 240 MG 24 hr capsule TAKE 1 CAPSULE BY MOUTH ONCE DAILY**DOSE INCREASE**   empagliflozin (JARDIANCE) 10 MG TABS tablet Take 1 tablet (10 mg total) by mouth daily before breakfast.   FLUoxetine (PROZAC) 10 MG capsule Take 1 capsule by mouth once daily   gabapentin (NEURONTIN) 300 MG capsule TAKE 1 CAPSULE BY MOUTH THREE TIMES DAILY   glipiZIDE (GLUCOTROL XL) 5 MG 24 hr tablet Take 1 tablet by mouth once daily with breakfast   lisinopril (ZESTRIL) 20 MG tablet TAKE 1 TABLET BY MOUTH ONCE DAILY IN THE MORNING WITH  LISINOPRIL/  HCTZ   lisinopril-hydrochlorothiazide (ZESTORETIC) 20-25 MG tablet TAKE 1 TABLET BY MOUTH ONCE DAILY IN THE MORNING WITH  LISINOPRIL   20MG    metFORMIN (GLUCOPHAGE-XR) 500 MG 24 hr tablet TAKE 1 TABLET BY MOUTH TWICE DAILY WITH A MEAL   sildenafil (REVATIO) 20 MG tablet TAKE 1 TABLET BY MOUTH ONCE DAILY AS NEEDED PRIOR  TO  ACTIVITY   triamcinolone cream (KENALOG) 0.1 % Apply 1 Application topically 2 (two) times daily.   Allergies  Allergen Reactions   Povidone-Iodine Other (See Comments)    BLISTERS   Statins     Insomnia, nightmares     Review of Systems    Objective:   BP 138/82 (BP Location: Right Arm, Patient Position: Sitting, Cuff Size: Normal)   Pulse 85   Resp 16   Ht 5\' 10"  (1.778 m)   Wt 238 lb (108 kg)   BMI 34.15 kg/m   Physical Exam NAD Lungs:  CTA CV:  RRR without murmur or rub.  Radial pulses normal and equal Back:  Two 1 cm skin tags on left low back.  Skin colored.  Many cherry angiomas. Legs:  deformation of bilateral LE below knees with scarring and pale, shiny.  Feet also with significant scarring, but much pinker than usual.     Assessment & Plan   Chronic leg pain:  Increased activity with better pain control.  Increase Gabapentin over next  3 weeks to 600 mg 3 times daily.  Will only prescribe Oxycodone 5 mg, 10 tabs in a 30 day period as he does look much more physically well today.  He will need to call for a refill.   Discussed if he starts isolating again and becomes more sedentary, will discontinue the oxycodone.    2.  DM:  mildly improved.  Increase Jardiance to 25 mg daily.  Encouraged him to decrease sugary drink intake again and find ways to remain physically active.    3.  Hx of PAfib:  asymptomatic, despite his increase in caffeinated energy drinks again.  Encouraged him to quit.  4.  Hypertension:  fair control.

## 2023-02-02 NOTE — Patient Instructions (Signed)
Take 2 caps of 300 mg Gabapentin every night  and continue the 1 cap twice daily during day for 1 week If tolerated, increase to 2 caps in morning, 1 cap in afternoon and 2 caps at night for 2nd week If tolerated, increase to 2 caps 3 times daily.

## 2023-02-08 ENCOUNTER — Other Ambulatory Visit: Payer: Self-pay | Admitting: Internal Medicine

## 2023-02-12 ENCOUNTER — Telehealth: Payer: Self-pay

## 2023-02-12 NOTE — Telephone Encounter (Signed)
Pharmacy called and needs meds from 02/02/2023 resent because they are not able to find them anywhere in their system. Medications include: Gabapentin 300mg , Jardiance 25mg  and Oxycodone 5mg .

## 2023-02-15 MED ORDER — OXYCODONE HCL 5 MG PO TABS: ORAL_TABLET | ORAL | 0 refills | Status: DC

## 2023-02-15 MED ORDER — GABAPENTIN 300 MG PO CAPS: ORAL_CAPSULE | ORAL | 3 refills | Status: DC

## 2023-02-15 MED ORDER — EMPAGLIFLOZIN 25 MG PO TABS: 25.0000 mg | ORAL_TABLET | Freq: Every day | ORAL | 3 refills | Status: DC

## 2023-02-16 NOTE — Telephone Encounter (Signed)
Patient has been notified

## 2023-03-05 ENCOUNTER — Telehealth: Payer: Self-pay

## 2023-03-05 MED ORDER — OXYCODONE HCL 5 MG PO TABS: ORAL_TABLET | ORAL | 0 refills | Status: DC

## 2023-03-05 NOTE — Telephone Encounter (Signed)
Patient would like a refill on oxycodone  

## 2023-03-09 ENCOUNTER — Other Ambulatory Visit: Payer: Self-pay | Admitting: Internal Medicine

## 2023-03-09 NOTE — Telephone Encounter (Signed)
Patient was notified.

## 2023-03-12 ENCOUNTER — Other Ambulatory Visit: Payer: Self-pay | Admitting: Internal Medicine

## 2023-03-31 ENCOUNTER — Other Ambulatory Visit: Payer: Self-pay | Admitting: Internal Medicine

## 2023-04-13 ENCOUNTER — Telehealth: Payer: Self-pay

## 2023-04-13 NOTE — Telephone Encounter (Signed)
Patient called to request refill on oxycodone. Would like it to be sent to [facility] on [address].

## 2023-04-16 MED ORDER — OXYCODONE HCL 5 MG PO TABS: ORAL_TABLET | ORAL | 0 refills | Status: DC

## 2023-04-19 ENCOUNTER — Telehealth: Payer: Self-pay | Admitting: Cardiology

## 2023-04-19 NOTE — Telephone Encounter (Signed)
*  STAT* If patient is at the pharmacy, call can be transferred to refill team.   1. Which medications need to be refilled? (please list name of each medication and dose if known)   diltiazem (CARTIA XT) 240 MG 24 hr capsule   2. Would you like to learn more about the convenience, safety, & potential cost savings by using the [facility]?   3. Are you open to using the [facility] (Type [facility]. ).  4. Which pharmacy/location (including street and city if local pharmacy) is medication to be sent to?  [facility] - [place], [place] - [address]   5. Do they need a 30 day or 90 day supply?   90 day  Patient stated he is almost out of this medication. Patient has appointment scheduled on 11/12.

## 2023-04-20 ENCOUNTER — Other Ambulatory Visit: Payer: Self-pay

## 2023-04-20 MED ORDER — DILTIAZEM HCL ER COATED BEADS 240 MG PO CP24: ORAL_CAPSULE | ORAL | 3 refills | Status: DC

## 2023-04-28 NOTE — Telephone Encounter (Signed)
Patient has been notified
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
## 2023-05-18 ENCOUNTER — Encounter: Payer: Self-pay | Admitting: Cardiology

## 2023-05-18 ENCOUNTER — Ambulatory Visit: Payer: BC Managed Care – PPO | Attending: Cardiology | Admitting: Cardiology

## 2023-05-18 VITALS — BP 122/78 | HR 83 | Ht 70.0 in | Wt 240.0 lb

## 2023-05-18 DIAGNOSIS — R0683 Snoring: Secondary | ICD-10-CM | POA: Diagnosis not present

## 2023-05-18 DIAGNOSIS — E785 Hyperlipidemia, unspecified: Secondary | ICD-10-CM | POA: Diagnosis not present

## 2023-05-18 DIAGNOSIS — I480 Paroxysmal atrial fibrillation: Secondary | ICD-10-CM | POA: Diagnosis not present

## 2023-05-18 DIAGNOSIS — I10 Essential (primary) hypertension: Secondary | ICD-10-CM | POA: Diagnosis not present

## 2023-05-18 NOTE — Patient Instructions (Addendum)
Medication Instructions:  Continue current medications *If you need a refill on your cardiac medications before your next appointment, please call your pharmacy*   Lab Work: BMET, Lipid, MG  If you have labs (blood work) drawn today and your tests are completely normal, you will receive your results only by: MyChart Message (if you have MyChart) OR A paper copy in the mail If you have any lab test that is abnormal or we need to change your treatment, we will call you to review the results.   Testing/Procedures: none   Follow-Up: At [facility], you and your health needs are our priority.  As part of our continuing mission to provide you with exceptional heart care, we have created designated Provider Care Teams.  These Care Teams include your primary Cardiologist (physician) and Advanced Practice Providers (APPs -  Physician Assistants and Nurse Practitioners) who all work together to provide you with the care you need, when you need it.  We recommend signing up for the patient portal called "MyChart".  Sign up information is provided on this After Visit Summary.  MyChart is used to connect with patients for Virtual Visits (Telemedicine).  Patients are able to view lab/test results, encounter notes, upcoming appointments, etc.  Non-urgent messages can be sent to your provider as well.   To learn more about what you can do with MyChart, go to [email or web address].    Your next appointment:   6 month(s)  Provider:   Dr. Bjorn Pippin

## 2023-05-19 LAB — BASIC METABOLIC PANEL
BUN/Creatinine Ratio: 17 (ref 9–20)
BUN: 18 mg/dL (ref 6–24)
CO2: 25 mmol/L (ref 20–29)
Calcium: 9.8 mg/dL (ref 8.7–10.2)
Chloride: 95 mmol/L — ABNORMAL LOW (ref 96–106)
Creatinine, Ser: 1.07 mg/dL (ref 0.76–1.27)
Glucose: 167 mg/dL — ABNORMAL HIGH (ref 70–99)
Potassium: 3.7 mmol/L (ref 3.5–5.2)
Sodium: 139 mmol/L (ref 134–144)
eGFR: 83 mL/min/{1.73_m2} (ref >59)

## 2023-05-19 LAB — MAGNESIUM: Magnesium: 2.2 mg/dL (ref 1.6–2.3)

## 2023-05-19 LAB — LIPID PANEL
Chol/HDL Ratio: 6.2 ratio — ABNORMAL HIGH (ref 0.0–5.0)
Cholesterol, Total: 156 mg/dL (ref 100–199)
HDL: 25 mg/dL — ABNORMAL LOW (ref >39)
LDL Chol Calc (NIH): 47 mg/dL (ref 0–99)
Triglycerides: 582 mg/dL (ref 0–149)
VLDL Cholesterol Cal: 84 mg/dL — ABNORMAL HIGH (ref 5–40)

## 2023-05-20 ENCOUNTER — Other Ambulatory Visit: Payer: Self-pay | Admitting: *Deleted

## 2023-05-20 DIAGNOSIS — E785 Hyperlipidemia, unspecified: Secondary | ICD-10-CM

## 2023-06-07 ENCOUNTER — Other Ambulatory Visit: Payer: BC Managed Care – PPO

## 2023-06-07 DIAGNOSIS — Z125 Encounter for screening for malignant neoplasm of prostate: Secondary | ICD-10-CM

## 2023-06-07 DIAGNOSIS — E785 Hyperlipidemia, unspecified: Secondary | ICD-10-CM

## 2023-06-07 DIAGNOSIS — Z0000 Encounter for general adult medical examination without abnormal findings: Secondary | ICD-10-CM

## 2023-06-07 DIAGNOSIS — E118 Type 2 diabetes mellitus with unspecified complications: Secondary | ICD-10-CM | POA: Diagnosis not present

## 2023-06-08 LAB — COMPREHENSIVE METABOLIC PANEL
ALT: 48 [IU]/L — ABNORMAL HIGH (ref 0–44)
AST: 29 [IU]/L (ref 0–40)
Albumin: 4.1 g/dL (ref 3.8–4.9)
Alkaline Phosphatase: 114 [IU]/L (ref 44–121)
BUN/Creatinine Ratio: 14 (ref 9–20)
BUN: 15 mg/dL (ref 6–24)
Bilirubin Total: 0.3 mg/dL (ref 0.0–1.2)
CO2: 25 mmol/L (ref 20–29)
Calcium: 9 mg/dL (ref 8.7–10.2)
Chloride: 98 mmol/L (ref 96–106)
Creatinine, Ser: 1.06 mg/dL (ref 0.76–1.27)
Globulin, Total: 2.6 g/dL (ref 1.5–4.5)
Glucose: 184 mg/dL — ABNORMAL HIGH (ref 70–99)
Potassium: 3.5 mmol/L (ref 3.5–5.2)
Sodium: 140 mmol/L (ref 134–144)
Total Protein: 6.7 g/dL (ref 6.0–8.5)
eGFR: 84 mL/min/{1.73_m2} (ref >59)

## 2023-06-08 LAB — MICROALBUMIN / CREATININE URINE RATIO
Creatinine, Urine: 73.1 mg/dL
Microalb/Creat Ratio: 37 mg/g{creat} — ABNORMAL HIGH (ref 0–29)
Microalbumin, Urine: 26.7 ug/mL

## 2023-06-08 LAB — LIPID PANEL W/O CHOL/HDL RATIO
Cholesterol, Total: 136 mg/dL (ref 100–199)
HDL: 23 mg/dL — ABNORMAL LOW (ref >39)
LDL Chol Calc (NIH): 37 mg/dL (ref 0–99)
Triglycerides: 538 mg/dL — ABNORMAL HIGH (ref 0–149)
VLDL Cholesterol Cal: 76 mg/dL — ABNORMAL HIGH (ref 5–40)

## 2023-06-08 LAB — CBC WITH DIFFERENTIAL/PLATELET
Basophils Absolute: 0.1 10*3/uL (ref 0.0–0.2)
Basos: 1 %
EOS (ABSOLUTE): 0.2 10*3/uL (ref 0.0–0.4)
Eos: 2 %
Hematocrit: 48.6 % (ref 37.5–51.0)
Hemoglobin: 16.2 g/dL (ref 13.0–17.7)
Immature Grans (Abs): 0 10*3/uL (ref 0.0–0.1)
Immature Granulocytes: 1 %
Lymphocytes Absolute: 1.8 10*3/uL (ref 0.7–3.1)
Lymphs: 22 %
MCH: 29 pg (ref 26.6–33.0)
MCHC: 33.3 g/dL (ref 31.5–35.7)
MCV: 87 fL (ref 79–97)
Monocytes Absolute: 0.5 10*3/uL (ref 0.1–0.9)
Monocytes: 6 %
Neutrophils Absolute: 5.7 10*3/uL (ref 1.4–7.0)
Neutrophils: 68 %
Platelets: 205 10*3/uL (ref 150–450)
RBC: 5.59 x10E6/uL (ref 4.14–5.80)
RDW: 14.1 % (ref 11.6–15.4)
WBC: 8.2 10*3/uL (ref 3.4–10.8)

## 2023-06-08 LAB — HEMOGLOBIN A1C
Est. average glucose Bld gHb Est-mCnc: 206 mg/dL
Hgb A1c MFr Bld: 8.8 % — ABNORMAL HIGH (ref 4.8–5.6)

## 2023-06-08 LAB — PSA: Prostate Specific Ag, Serum: 0.8 ng/mL (ref 0.0–4.0)

## 2023-06-10 ENCOUNTER — Encounter: Payer: Self-pay | Admitting: Internal Medicine

## 2023-06-10 ENCOUNTER — Ambulatory Visit: Payer: BC Managed Care – PPO | Admitting: Internal Medicine

## 2023-06-10 VITALS — BP 156/78 | HR 92 | Resp 16 | Ht 70.0 in | Wt 242.5 lb

## 2023-06-10 DIAGNOSIS — E118 Type 2 diabetes mellitus with unspecified complications: Secondary | ICD-10-CM | POA: Diagnosis not present

## 2023-06-10 DIAGNOSIS — E785 Hyperlipidemia, unspecified: Secondary | ICD-10-CM

## 2023-06-10 DIAGNOSIS — I10 Essential (primary) hypertension: Secondary | ICD-10-CM | POA: Diagnosis not present

## 2023-06-10 DIAGNOSIS — Z0000 Encounter for general adult medical examination without abnormal findings: Secondary | ICD-10-CM | POA: Diagnosis not present

## 2023-06-10 DIAGNOSIS — I480 Paroxysmal atrial fibrillation: Secondary | ICD-10-CM

## 2023-06-10 DIAGNOSIS — M79605 Pain in left leg: Secondary | ICD-10-CM

## 2023-06-10 DIAGNOSIS — M79604 Pain in right leg: Secondary | ICD-10-CM

## 2023-06-10 DIAGNOSIS — G8929 Other chronic pain: Secondary | ICD-10-CM

## 2023-06-10 MED ORDER — OXYCODONE HCL 5 MG PO TABS: ORAL_TABLET | ORAL | 0 refills | Status: DC

## 2023-06-10 MED ORDER — FENOFIBRATE 145 MG PO TABS: 145.0000 mg | ORAL_TABLET | Freq: Every day | ORAL | 11 refills | Status: DC
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
## 2023-06-12 ENCOUNTER — Other Ambulatory Visit: Payer: Self-pay | Admitting: Internal Medicine

## 2023-06-25 LAB — HM DIABETES EYE EXAM

## 2023-08-12 ENCOUNTER — Other Ambulatory Visit: Payer: BC Managed Care – PPO

## 2023-09-04 ENCOUNTER — Other Ambulatory Visit: Payer: Self-pay | Admitting: Internal Medicine

## 2023-09-06 ENCOUNTER — Telehealth: Payer: Self-pay

## 2023-09-06 NOTE — Telephone Encounter (Signed)
 Patient called to get a refill on Oxycodone medication.

## 2023-09-09 ENCOUNTER — Other Ambulatory Visit: Payer: BC Managed Care – PPO

## 2023-09-09 DIAGNOSIS — Z79899 Other long term (current) drug therapy: Secondary | ICD-10-CM

## 2023-09-09 DIAGNOSIS — E118 Type 2 diabetes mellitus with unspecified complications: Secondary | ICD-10-CM | POA: Diagnosis not present

## 2023-09-09 DIAGNOSIS — E785 Hyperlipidemia, unspecified: Secondary | ICD-10-CM

## 2023-09-09 MED ORDER — OXYCODONE HCL 5 MG PO TABS: ORAL_TABLET | ORAL | 0 refills | Status: AC

## 2023-09-10 LAB — CBC WITH DIFFERENTIAL/PLATELET
Basophils Absolute: 0.1 10*3/uL (ref 0.0–0.2)
Basos: 1 %
EOS (ABSOLUTE): 0.2 10*3/uL (ref 0.0–0.4)
Eos: 2 %
Hematocrit: 48 % (ref 37.5–51.0)
Hemoglobin: 16.3 g/dL (ref 13.0–17.7)
Immature Grans (Abs): 0 10*3/uL (ref 0.0–0.1)
Immature Granulocytes: 1 %
Lymphocytes Absolute: 2.4 10*3/uL (ref 0.7–3.1)
Lymphs: 28 %
MCH: 29.5 pg (ref 26.6–33.0)
MCHC: 34 g/dL (ref 31.5–35.7)
MCV: 87 fL (ref 79–97)
Monocytes Absolute: 0.5 10*3/uL (ref 0.1–0.9)
Monocytes: 6 %
Neutrophils Absolute: 5.2 10*3/uL (ref 1.4–7.0)
Neutrophils: 62 %
Platelets: 249 10*3/uL (ref 150–450)
RBC: 5.52 x10E6/uL (ref 4.14–5.80)
RDW: 13.9 % (ref 11.6–15.4)
WBC: 8.4 10*3/uL (ref 3.4–10.8)

## 2023-09-10 LAB — COMPREHENSIVE METABOLIC PANEL
ALT: 44 IU/L (ref 0–44)
AST: 34 IU/L (ref 0–40)
Albumin: 4.5 g/dL (ref 3.8–4.9)
Alkaline Phosphatase: 88 IU/L (ref 44–121)
BUN/Creatinine Ratio: 16 (ref 9–20)
BUN: 18 mg/dL (ref 6–24)
Bilirubin Total: 0.4 mg/dL (ref 0.0–1.2)
CO2: 25 mmol/L (ref 20–29)
Calcium: 9.7 mg/dL (ref 8.7–10.2)
Chloride: 98 mmol/L (ref 96–106)
Creatinine, Ser: 1.14 mg/dL (ref 0.76–1.27)
Globulin, Total: 2.7 g/dL (ref 1.5–4.5)
Glucose: 152 mg/dL — ABNORMAL HIGH (ref 70–99)
Potassium: 3.7 mmol/L (ref 3.5–5.2)
Sodium: 142 mmol/L (ref 134–144)
Total Protein: 7.2 g/dL (ref 6.0–8.5)
eGFR: 77 mL/min/{1.73_m2} (ref >59)

## 2023-09-10 LAB — LIPID PANEL W/O CHOL/HDL RATIO
Cholesterol, Total: 147 mg/dL (ref 100–199)
HDL: 25 mg/dL — ABNORMAL LOW (ref >39)
LDL Chol Calc (NIH): 47 mg/dL (ref 0–99)
Triglycerides: 514 mg/dL — ABNORMAL HIGH (ref 0–149)
VLDL Cholesterol Cal: 75 mg/dL — ABNORMAL HIGH (ref 5–40)

## 2023-09-10 LAB — HEMOGLOBIN A1C
Est. average glucose Bld gHb Est-mCnc: 226 mg/dL
Hgb A1c MFr Bld: 9.5 % — ABNORMAL HIGH (ref 4.8–5.6)

## 2023-09-10 NOTE — Telephone Encounter (Signed)
 Patient has been notified of refill.

## 2023-09-11 ENCOUNTER — Other Ambulatory Visit: Payer: Self-pay | Admitting: Internal Medicine

## 2023-09-20 ENCOUNTER — Encounter: Payer: Self-pay | Admitting: Internal Medicine

## 2023-10-13 ENCOUNTER — Other Ambulatory Visit: Payer: Self-pay | Admitting: Internal Medicine

## 2023-11-08 ENCOUNTER — Other Ambulatory Visit: Payer: Self-pay | Admitting: Internal Medicine

## 2023-11-10 NOTE — Telephone Encounter (Signed)
 Can you call and find out how often he is using this med?  Most of the time this should last for months.

## 2023-11-12 NOTE — Telephone Encounter (Signed)
 Spoke with patient and confirms he is using a lot lately due to having a friend

## 2023-12-09 ENCOUNTER — Other Ambulatory Visit: Payer: BC Managed Care – PPO

## 2023-12-13 ENCOUNTER — Ambulatory Visit: Payer: BC Managed Care – PPO | Admitting: Internal Medicine

## 2023-12-14 ENCOUNTER — Other Ambulatory Visit: Payer: Self-pay | Admitting: Internal Medicine

## 2023-12-17 ENCOUNTER — Other Ambulatory Visit: Payer: Self-pay

## 2023-12-17 NOTE — Telephone Encounter (Signed)
Left message for patient to return call concerning medication.

## 2023-12-20 NOTE — Telephone Encounter (Signed)
 Patient reports he does not need refill on medication

## 2024-01-12 ENCOUNTER — Other Ambulatory Visit: Payer: Self-pay | Admitting: Internal Medicine

## 2024-02-10 ENCOUNTER — Other Ambulatory Visit

## 2024-02-11 ENCOUNTER — Other Ambulatory Visit

## 2024-02-11 DIAGNOSIS — E118 Type 2 diabetes mellitus with unspecified complications: Secondary | ICD-10-CM | POA: Diagnosis not present

## 2024-02-12 LAB — HEMOGLOBIN A1C
Est. average glucose Bld gHb Est-mCnc: 203 mg/dL
Hgb A1c MFr Bld: 8.7 % — ABNORMAL HIGH (ref 4.8–5.6)

## 2024-02-14 ENCOUNTER — Ambulatory Visit: Admitting: Internal Medicine

## 2024-02-14 ENCOUNTER — Encounter: Payer: Self-pay | Admitting: Internal Medicine

## 2024-02-14 VITALS — BP 138/74 | HR 90 | Resp 18 | Ht 70.0 in | Wt 239.0 lb

## 2024-02-14 DIAGNOSIS — E118 Type 2 diabetes mellitus with unspecified complications: Secondary | ICD-10-CM

## 2024-02-14 DIAGNOSIS — I10 Essential (primary) hypertension: Secondary | ICD-10-CM | POA: Diagnosis not present

## 2024-02-14 MED ORDER — TRIAMCINOLONE ACETONIDE 0.1 % EX CREA: 1.0000 | TOPICAL_CREAM | Freq: Two times a day (BID) | CUTANEOUS | 1 refills | Status: AC

## 2024-02-14 MED ORDER — TIRZEPATIDE 2.5 MG/0.5ML ~~LOC~~ SOAJ
2.5000 mg | SUBCUTANEOUS | 11 refills | Status: DC
Start: 2024-02-14 — End: 2024-04-07

## 2024-02-14 MED ORDER — BLOOD GLUCOSE MONITORING SUPPL DEVI: 0 refills | Status: AC

## 2024-02-14 MED ORDER — LANCETS MISC. MISC: 11 refills | Status: AC

## 2024-02-14 MED ORDER — BLOOD GLUCOSE TEST VI STRP: ORAL_STRIP | 11 refills | Status: AC

## 2024-02-14 MED ORDER — LANCET DEVICE MISC
0 refills | Status: AC
Start: 2024-02-14 — End: ?

## 2024-02-14 NOTE — Progress Notes (Signed)
    Subjective:    Patient ID: Timothy [name], male   DOB: April 02, 1971, 53 y.o.   MRN: [id number]   HPI   DM:  A1C down a bit to 8.7%.  States he is taking his meds.  A couple of deaths of a friend and his girlfriend's father.  Multiple reasons given for why not getting diet and sugars under control.  He states he is okay if his life expectancy is cut short if he can get his work done, so he continues to drink [employer] throughout the day to stay on task.  2.  Was missing fenofibrate , gabapentin  at different times since March  He accidentally took leftover fluoxetine  instead of fenofibrate  and could not get anything done.  Realized the problem.     Current Meds  Medication Sig   b complex vitamins capsule Take 1 capsule by mouth daily.   Blood Glucose Monitoring Suppl (AGAMATRIX PRESTO) w/Device KIT Check blood glucose twice daily before meals   diltiazem  (CARTIA  XT) 240 MG 24 hr capsule TAKE 1 CAPSULE BY MOUTH ONCE DAILY**DOSE INCREASE**   empagliflozin  (JARDIANCE ) 25 MG TABS tablet Take 1 tablet (25 mg total) by mouth daily before breakfast.   fenofibrate  (TRICOR ) 145 MG tablet Take 1 tablet (145 mg total) by mouth daily.   gabapentin  (NEURONTIN ) 300 MG capsule TAKE 2 CAPSULEs BY MOUTH THREE TIMES DAILY   glipiZIDE  (GLUCOTROL  XL) 5 MG 24 hr tablet Take 1 tablet by mouth once daily with breakfast   glucose blood (AGAMATRIX PRESTO TEST) test strip Check blood glucose twice daily before meals   lisinopril  (ZESTRIL ) 20 MG tablet TAKE 1 TABLET BY MOUTH ONCE DAILY IN THE MORNING WITH LISINOPRIL /HCTZ   lisinopril -hydrochlorothiazide  (ZESTORETIC ) 20-25 MG tablet TAKE 1 TABLET BY MOUTH ONCE DAILY IN THE MORNING WITH LISINOPRIL  20 MG   metFORMIN  (GLUCOPHAGE -XR) 500 MG 24 hr tablet TAKE 1 TABLET BY MOUTH TWICE DAILY WITH A MEAL   oxyCODONE  (OXY IR/ROXICODONE ) 5 MG immediate release tablet 1 tab by mouth daily as needed for severe leg pain   sildenafil  (REVATIO ) 20 MG tablet TAKE 1 TABLET BY MOUTH ONCE  DAILY AS NEEDED PRIOR TO ACTIVITY   triamcinolone  cream (KENALOG ) 0.1 % Apply 1 Application topically 2 (two) times daily.   Allergies  Allergen Reactions   Povidone-Iodine Other (See Comments)    BLISTERS   Statins     Insomnia, nightmares     Review of Systems    Objective:   BP 138/74 (BP Location: Left Arm, Patient Position: Sitting)   Pulse 90   Resp 18   Ht 5' 10 (1.778 m)   Wt 239 lb (108.4 kg)   BMI 34.29 kg/m   Physical Exam Chronically ill appearing. Lungs:  CTA CV:  RRR without murmur or rub.  Radial pulses normal and equal    Assessment & Plan   DM:  willing to consider Mounjaro  2.5 mg weekly.  He will call when he has and what day he plans to start.  Rx for glucometer and supplies along with sugar log to check twice daily before meals.  Follow up 2 weeks after starts Mounjaro .  Get more physically active.

## 2024-02-21 ENCOUNTER — Telehealth: Payer: Self-pay

## 2024-02-21 NOTE — Telephone Encounter (Signed)
 PT.notified of what medication has been discontinued.

## 2024-02-23 NOTE — Telephone Encounter (Signed)
 Patient has been scheduled for weight check and sugar log on 03/10/24.

## 2024-03-04 ENCOUNTER — Other Ambulatory Visit: Payer: Self-pay | Admitting: Internal Medicine

## 2024-03-06 ENCOUNTER — Other Ambulatory Visit: Payer: Self-pay | Admitting: Internal Medicine

## 2024-03-10 ENCOUNTER — Other Ambulatory Visit

## 2024-03-10 NOTE — Progress Notes (Signed)
 Pt. current weight 234 lbs Previous weight 239 lbs Dose amount 2.5 Injection Day Monday Reports no issues. Started Mounjaro  3 weeks ago. 02/18/2024.Other Meds Jardiance  25mg  Metformin  500mg  Glipizide  5mg 

## 2024-03-13 NOTE — Progress Notes (Signed)
 Maintain meds as is--notify to call if sugars dropping below 100. Tell him to quit missing meds.

## 2024-03-18 ENCOUNTER — Other Ambulatory Visit: Payer: Self-pay | Admitting: Internal Medicine

## 2024-03-23 ENCOUNTER — Telehealth: Payer: Self-pay | Admitting: Cardiology

## 2024-03-23 MED ORDER — DILTIAZEM HCL ER COATED BEADS 240 MG PO CP24: ORAL_CAPSULE | ORAL | 0 refills | Status: DC

## 2024-03-23 NOTE — Telephone Encounter (Signed)
*  STAT* If patient is at the pharmacy, call can be transferred to refill team.   1. Which medications need to be refilled? (please list name of each medication and dose if known)   diltiazem  (CARTIA  XT) 240 MG 24 hr capsule   2. Would you like to learn more about the convenience, safety, & potential cost savings by using the [facility]?   3. Are you open to using the [facility] (Type [facility]. ).  4. Which pharmacy/location (including street and city if local pharmacy) is medication to be sent to?  [facility] - [place], [place] - [address]   5. Do they need a 30 day or 90 day supply?   Patient stated he has some medication left.  Patient has appointment scheduled with Dr. Kate on 11/6.

## 2024-03-23 NOTE — Telephone Encounter (Signed)
 Pt's medication was sent to pt's pharmacy as requested. Confirmation received.

## 2024-03-24 ENCOUNTER — Other Ambulatory Visit

## 2024-03-24 MED ORDER — TIRZEPATIDE 5 MG/0.5ML ~~LOC~~ SOAJ: 5.0000 mg | SUBCUTANEOUS | 11 refills | Status: DC

## 2024-03-24 NOTE — Progress Notes (Signed)
 Pt. current weight 236  lbs Previous weight 234 lbs Dose amount 2.5 Injection Day Monday Reports no issues. Started Mounjaro  2.5 5weeks ago. Other Meds Jardiance  25mg  Metformin  500mg  Glipizide  5mg .Per Dr. Adella patient is to increase Mounjaro  to 5mg . Follow up in 2 weeks.

## 2024-03-30 ENCOUNTER — Other Ambulatory Visit: Payer: Self-pay | Admitting: Internal Medicine

## 2024-04-07 ENCOUNTER — Other Ambulatory Visit

## 2024-04-07 NOTE — Progress Notes (Signed)
 See if he can continue, but if not, decrease to the 2.5 mg after another try. Make sure he give the injection after meals. Follow up in 2 weeks.

## 2024-04-07 NOTE — Progress Notes (Addendum)
 Pt. current weight 235  lbs Previous weight 236 lbs Dose amount 0.5 Mounjuro Injection Day Monday Reports increase has caused serve indigestion. Started Mounjaro  03-28-24 .0.5 Started Other Meds Jardiance  25mg  Metformin  500mg  Glipizide  5mg . Am highest 219 lowest am 127 Pm highest 251 Lowest pm 143
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
## 2024-05-11 ENCOUNTER — Ambulatory Visit (HOSPITAL_BASED_OUTPATIENT_CLINIC_OR_DEPARTMENT_OTHER): Admitting: Cardiology

## 2024-05-17 ENCOUNTER — Other Ambulatory Visit: Payer: Self-pay | Admitting: Internal Medicine

## 2024-05-19 ENCOUNTER — Other Ambulatory Visit (INDEPENDENT_AMBULATORY_CARE_PROVIDER_SITE_OTHER)

## 2024-05-19 DIAGNOSIS — E118 Type 2 diabetes mellitus with unspecified complications: Secondary | ICD-10-CM | POA: Diagnosis not present

## 2024-05-19 DIAGNOSIS — E785 Hyperlipidemia, unspecified: Secondary | ICD-10-CM

## 2024-05-19 MED ORDER — TIRZEPATIDE 7.5 MG/0.5ML ~~LOC~~ SOAJ: 7.5000 mg | SUBCUTANEOUS | 11 refills | Status: AC

## 2024-05-19 NOTE — Progress Notes (Signed)
 Patient has been on mounjaro  5mg  for about a month now. Patient has continued to take metformin  and jardiance  daily.   Patient will start Mounjaro  7.5mg  weekly and return in 3 weeks for weight check/sugar logs.

## 2024-05-20 LAB — LIPID PANEL W/O CHOL/HDL RATIO
Cholesterol, Total: 132 mg/dL (ref 100–199)
HDL: 21 mg/dL — ABNORMAL LOW (ref >39)
LDL Chol Calc (NIH): 62 mg/dL (ref 0–99)
Triglycerides: 310 mg/dL — ABNORMAL HIGH (ref 0–149)
VLDL Cholesterol Cal: 49 mg/dL — ABNORMAL HIGH (ref 5–40)

## 2024-05-20 LAB — HEMOGLOBIN A1C
Est. average glucose Bld gHb Est-mCnc: 151 mg/dL
Hgb A1c MFr Bld: 6.9 % — ABNORMAL HIGH (ref 4.8–5.6)

## 2024-05-22 ENCOUNTER — Other Ambulatory Visit: Payer: Self-pay | Admitting: Internal Medicine
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
## 2024-05-23 ENCOUNTER — Encounter (HOSPITAL_BASED_OUTPATIENT_CLINIC_OR_DEPARTMENT_OTHER): Payer: Self-pay | Admitting: Cardiology

## 2024-05-23 ENCOUNTER — Ambulatory Visit (HOSPITAL_BASED_OUTPATIENT_CLINIC_OR_DEPARTMENT_OTHER): Admitting: Cardiology

## 2024-05-23 VITALS — BP 120/72 | HR 72 | Ht 70.0 in | Wt 233.8 lb

## 2024-05-23 DIAGNOSIS — R0683 Snoring: Secondary | ICD-10-CM

## 2024-05-23 DIAGNOSIS — E785 Hyperlipidemia, unspecified: Secondary | ICD-10-CM

## 2024-05-23 DIAGNOSIS — I480 Paroxysmal atrial fibrillation: Secondary | ICD-10-CM

## 2024-05-23 DIAGNOSIS — I10 Essential (primary) hypertension: Secondary | ICD-10-CM

## 2024-05-23 NOTE — Patient Instructions (Signed)
 Medication Instructions:  Your physician recommends that you continue on your current medications as directed. Please refer to the Current Medication list given to you today.  *If you need a refill on your cardiac medications before your next appointment, please call your pharmacy*  Lab Work: NONE  Testing/Procedures: NONE  Follow-Up: At [facility], you and your health needs are our priority.  As part of our continuing mission to provide you with exceptional heart care, we have created designated Provider Care Teams.  These Care Teams include your primary Cardiologist (physician) and Advanced Practice Providers (APPs -  Physician Assistants and Nurse Practitioners) who all work together to provide you with the care you need, when you need it.  We recommend signing up for the patient portal called MyChart.  Sign up information is provided on this After Visit Summary.  MyChart is used to connect with patients for Virtual Visits (Telemedicine).  Patients are able to view lab/test results, encounter notes, upcoming appointments, etc.  Non-urgent messages can be sent to your provider as well.   To learn more about what you can do with MyChart, go to [email or web address].    Your next appointment:   6 month(s)  The format for your next appointment:   In Person  Provider:   Lonni Regan, MD

## 2024-05-25 ENCOUNTER — Ambulatory Visit: Admitting: Internal Medicine

## 2024-05-25 ENCOUNTER — Encounter: Payer: Self-pay | Admitting: Internal Medicine

## 2024-05-25 VITALS — BP 140/80 | HR 84 | Resp 18 | Ht 70.0 in | Wt 230.5 lb

## 2024-05-25 DIAGNOSIS — I480 Paroxysmal atrial fibrillation: Secondary | ICD-10-CM | POA: Diagnosis not present

## 2024-05-25 DIAGNOSIS — I10 Essential (primary) hypertension: Secondary | ICD-10-CM

## 2024-05-25 DIAGNOSIS — E785 Hyperlipidemia, unspecified: Secondary | ICD-10-CM | POA: Diagnosis not present

## 2024-05-25 DIAGNOSIS — E118 Type 2 diabetes mellitus with unspecified complications: Secondary | ICD-10-CM

## 2024-05-25 NOTE — Patient Instructions (Signed)
 Restart Omega 3 fatty acids 1000 mg twice daily.

## 2024-05-25 NOTE — Progress Notes (Signed)
 "   Subjective:    Patient ID: Timothy [name], male   DOB: 12/07/1970, 53 y.o.   MRN: [id number]   HPI   DM:  A1C down to goal at 6.9%.  He has not increased Mounjaro  to 7.5 mg yet.  Will take it up this Monday.  Has been off glipizide  now for 3 months.  Has lost about 9 lbs since starting Mounjaro .  Continues on Metformin  and Jardiance  as well.  Has concoction to replace red bull without all the sugar and caffeine.  2.  Hyperlipidemia:  Trigs better, but still too high at 310.  HDL low.  He is trying to increase physical activity.  He states he is using the Fenofibrate  145 mg daily.  Does not feel he is missing the dose.  He is not taking the Omega 3 fatty acids any longer.   Lipid Panel     Component Value Date/Time   CHOL 132 05/19/2024 0947   TRIG 310 (H) 05/19/2024 0947   HDL 21 (L) 05/19/2024 0947   CHOLHDL 6.2 (H) 05/18/2023 1123   LDLCALC 62 05/19/2024 0947   LABVLDL 49 (H) 05/19/2024 0947   3.  Hypertension:  Had a red bull before coming in and bad morning.  BP was fine with cardiology at 120/72  Current Meds  Medication Sig   Blood Glucose Monitoring Suppl DEVI May substitute to any manufacturer covered by patient's insurance.  Check blood glucose twice daily before meals   diltiazem  (CARTIA  XT) 240 MG 24 hr capsule TAKE 1 CAPSULE BY MOUTH ONCE DAILY   empagliflozin  (JARDIANCE ) 25 MG TABS tablet TAKE 1 TABLET BY MOUTH ONCE DAILY BEFORE BREAKFAST   fenofibrate  (TRICOR ) 145 MG tablet Take 1 tablet by mouth once daily   gabapentin  (NEURONTIN ) 300 MG capsule TAKE 2 CAPSULES BY MOUTH THREE TIMES DAILY ** TITRATING TO 600 MG 3 TIMES DAILY **   Glucose Blood (BLOOD GLUCOSE TEST STRIPS) STRP May substitute to any manufacturer covered by patient's insurance.Check blood glucose twice daily before mails   Lancet Device MISC May substitute to any manufacturer covered by patient's insurance.  Check blood glucose twice daily before meals.   Lancets Misc. MISC May substitute to any  manufacturer covered by patient's insurance.  Check blood glucose twice daily before meals.   lisinopril  (ZESTRIL ) 20 MG tablet TAKE 1 TABLET BY MOUTH ONCE DAILY IN THE MORNING WITH  LISINOPRIL /HCTZ   lisinopril -hydrochlorothiazide  (ZESTORETIC ) 20-25 MG tablet TAKE 1 TABLET BY MOUTH ONCE DAILY IN THE MORNING WITH  LISINOPRIL   20  MG   metFORMIN  (GLUCOPHAGE -XR) 500 MG 24 hr tablet TAKE 1 TABLET BY MOUTH TWICE DAILY WITH A MEAL   sildenafil  (REVATIO ) 20 MG tablet TAKE 1 TABLET BY MOUTH ONCE DAILY AS NEEDED PRIOR  TO  ACTIVITY   tirzepatide  (MOUNJARO ) 7.5 MG/0.5ML Pen Inject 7.5 mg into the skin once a week.   triamcinolone  cream (KENALOG ) 0.1 % Apply 1 Application topically 2 (two) times daily.   Allergies  Allergen Reactions   Povidone-Iodine Other (See Comments)    BLISTERS   Statins     Insomnia, nightmares     Review of Systems    Objective:   BP (!) 140/80 (BP Location: Right Arm, Patient Position: Sitting, Cuff Size: Normal)   Pulse 84   Resp 18   Ht 5' 10 (1.778 m)   Wt 230 lb 8 oz (104.6 kg)   BMI 33.07 kg/m   Physical Exam Constitutional:      Appearance: He  is obese.  Eyes:     Extraocular Movements: Extraocular movements intact.     Conjunctiva/sclera: Conjunctivae normal.     Pupils: Pupils are equal, round, and reactive to light.  Cardiovascular:     Rate and Rhythm: Normal rate and regular rhythm.     Pulses: Normal pulses.     Heart sounds: S1 normal and S2 normal. No murmur heard.    No friction rub. No S3 or S4 sounds.  Pulmonary:     Effort: Pulmonary effort is normal.     Breath sounds: Normal breath sounds.  Abdominal:     General: Bowel sounds are normal.     Palpations: Abdomen is soft.  Musculoskeletal:     Cervical back: Normal range of motion and neck supple.     Right lower leg: No edema.     Left lower leg: No edema.  Neurological:     Mental Status: He is alert.      Assessment & Plan   DM:  hope to wean Metformin  with increase in  Mounjaro .  Much improved control of DM, however.  FU in 2 weeks after increase of Mounjaro  to 7.5 mg for weight and sugar logs.  2.  Hypertension:  Recently fine at Cardiology, but drank a Red Bull before coming in today.  Monitor.    3.  Dyslipidemia:  Did not tolerate Rosuvastatin  in past.  Somewhat imroved, but still not at goal with trigs and HDL on Fenofibrate  alone.  Encouraged him to get back on Omega 3 FA.  Repeat FLP in 3 months.    4.  HM:  list for when COVID vaccines back in.  Encouraged to obtain at pharmacy of choice if does not hear from us  soon.   5.  Paroxysmal Afib:  no palpitations and monitor reportedly negative for any episodes. "

## 2024-06-03 ENCOUNTER — Other Ambulatory Visit: Payer: Self-pay | Admitting: Internal Medicine

## 2024-06-09 ENCOUNTER — Other Ambulatory Visit: Payer: BC Managed Care – PPO

## 2024-06-09 ENCOUNTER — Other Ambulatory Visit

## 2024-06-12 ENCOUNTER — Encounter: Payer: BC Managed Care – PPO | Admitting: Internal Medicine

## 2024-06-16 ENCOUNTER — Other Ambulatory Visit (INDEPENDENT_AMBULATORY_CARE_PROVIDER_SITE_OTHER)

## 2024-06-16 NOTE — Progress Notes (Unsigned)
 Todays wight = 230  Last time wight = 230  Sugar ranges AM = 127-159  PM= 128-183 The patient is on Mounjaro  7.5 , Metformin  500 , Jrdaince 25mg   The patient is scheduled for 12/29 for wight check and sugar log  I called the patient to remind today but he did not answered

## 2024-06-22 ENCOUNTER — Other Ambulatory Visit: Payer: Self-pay

## 2024-06-23 MED ORDER — DILTIAZEM HCL ER COATED BEADS 240 MG PO CP24: ORAL_CAPSULE | ORAL | 3 refills | Status: AC

## 2024-07-03 ENCOUNTER — Other Ambulatory Visit (INDEPENDENT_AMBULATORY_CARE_PROVIDER_SITE_OTHER)

## 2024-07-03 NOTE — Progress Notes (Unsigned)
 232llb  Last time 230 lb  His sugars am  = 113- 158   pm =111- 183 The patient is getting Metformin  500 mg , Jardiance  25 mg   Per Dr. Adella:  no wean of Metformin .  Needs to continue to work on diet and physical activity. Keep appts for labs and follow up in Feb and bring in sugar log with lab visit.   Tried to notify the patient but did not answer

## 2024-08-25 ENCOUNTER — Other Ambulatory Visit

## 2024-08-30 ENCOUNTER — Encounter: Admitting: Internal Medicine
